# Patient Record
Sex: Male | Born: 1935 | Race: White | Hispanic: No | Marital: Married | Smoking: Never smoker
Health system: Southern US, Community
[De-identification: ages and names within clinical notes are randomized; demographics above are authoritative.]

## PROBLEM LIST (undated history)

## (undated) DIAGNOSIS — G8929 Other chronic pain: Secondary | ICD-10-CM

## (undated) DIAGNOSIS — F419 Anxiety disorder, unspecified: Secondary | ICD-10-CM

## (undated) DIAGNOSIS — M1711 Unilateral primary osteoarthritis, right knee: Secondary | ICD-10-CM

## (undated) DIAGNOSIS — S32030A Wedge compression fracture of third lumbar vertebra, initial encounter for closed fracture: Secondary | ICD-10-CM

## (undated) DIAGNOSIS — K219 Gastro-esophageal reflux disease without esophagitis: Secondary | ICD-10-CM

## (undated) DIAGNOSIS — M792 Neuralgia and neuritis, unspecified: Secondary | ICD-10-CM

## (undated) HISTORY — DX: Wedge compression fracture of third lumbar vertebra, initial encounter for closed fracture: S32.030A

## (undated) HISTORY — DX: Anxiety disorder, unspecified: F41.9

## (undated) HISTORY — DX: Unilateral primary osteoarthritis, right knee: M17.11

## (undated) HISTORY — PX: KYPHOPLASTY: SHX5884

## (undated) HISTORY — DX: Gastro-esophageal reflux disease without esophagitis: K21.9

## (undated) HISTORY — DX: Other chronic pain: G89.29

## (undated) HISTORY — DX: Neuralgia and neuritis, unspecified: M79.2

---

## 2014-03-05 DIAGNOSIS — K5730 Diverticulosis of large intestine without perforation or abscess without bleeding: Secondary | ICD-10-CM

## 2014-03-05 DIAGNOSIS — R7303 Prediabetes: Secondary | ICD-10-CM | POA: Insufficient documentation

## 2014-03-05 DIAGNOSIS — F419 Anxiety disorder, unspecified: Secondary | ICD-10-CM | POA: Insufficient documentation

## 2014-03-05 DIAGNOSIS — N400 Enlarged prostate without lower urinary tract symptoms: Secondary | ICD-10-CM | POA: Insufficient documentation

## 2014-03-05 DIAGNOSIS — I10 Essential (primary) hypertension: Secondary | ICD-10-CM | POA: Insufficient documentation

## 2014-03-05 HISTORY — DX: Diverticulosis of large intestine without perforation or abscess without bleeding: K57.30

## 2014-06-21 DIAGNOSIS — L570 Actinic keratosis: Secondary | ICD-10-CM | POA: Insufficient documentation

## 2016-07-27 DIAGNOSIS — B351 Tinea unguium: Secondary | ICD-10-CM | POA: Insufficient documentation

## 2017-02-24 DIAGNOSIS — S32030A Wedge compression fracture of third lumbar vertebra, initial encounter for closed fracture: Secondary | ICD-10-CM | POA: Insufficient documentation

## 2017-03-18 ENCOUNTER — Other Ambulatory Visit: Payer: Self-pay | Admitting: Orthopedic Surgery

## 2017-03-18 DIAGNOSIS — S32030D Wedge compression fracture of third lumbar vertebra, subsequent encounter for fracture with routine healing: Secondary | ICD-10-CM

## 2017-03-23 ENCOUNTER — Ambulatory Visit
Admission: RE | Admit: 2017-03-23 | Discharge: 2017-03-23 | Disposition: A | Discharge status: Home / Self Care | Payer: Medicare Other | Source: Ambulatory Visit | Attending: Orthopedic Surgery | Admitting: Orthopedic Surgery

## 2017-03-23 DIAGNOSIS — S32030D Wedge compression fracture of third lumbar vertebra, subsequent encounter for fracture with routine healing: Secondary | ICD-10-CM | POA: Insufficient documentation

## 2017-03-23 DIAGNOSIS — X58XXXD Exposure to other specified factors, subsequent encounter: Secondary | ICD-10-CM | POA: Diagnosis not present

## 2017-03-23 DIAGNOSIS — R29890 Loss of height: Secondary | ICD-10-CM | POA: Insufficient documentation

## 2017-03-23 DIAGNOSIS — M47896 Other spondylosis, lumbar region: Secondary | ICD-10-CM | POA: Insufficient documentation

## 2017-03-26 DIAGNOSIS — Z8679 Personal history of other diseases of the circulatory system: Secondary | ICD-10-CM

## 2017-03-26 DIAGNOSIS — E785 Hyperlipidemia, unspecified: Secondary | ICD-10-CM | POA: Insufficient documentation

## 2017-03-26 DIAGNOSIS — H353190 Nonexudative age-related macular degeneration, unspecified eye, stage unspecified: Secondary | ICD-10-CM | POA: Insufficient documentation

## 2017-03-26 DIAGNOSIS — H3530 Unspecified macular degeneration: Secondary | ICD-10-CM | POA: Insufficient documentation

## 2017-03-26 HISTORY — DX: Personal history of other diseases of the circulatory system: Z86.79

## 2017-04-22 ENCOUNTER — Other Ambulatory Visit: Payer: Self-pay | Admitting: Internal Medicine

## 2017-04-22 DIAGNOSIS — R011 Cardiac murmur, unspecified: Secondary | ICD-10-CM

## 2017-04-22 DIAGNOSIS — M7989 Other specified soft tissue disorders: Secondary | ICD-10-CM

## 2017-04-30 ENCOUNTER — Ambulatory Visit
Admission: RE | Admit: 2017-04-30 | Discharge: 2017-04-30 | Disposition: A | Discharge status: Home / Self Care | Payer: Medicare Other | Source: Ambulatory Visit | Attending: Internal Medicine | Admitting: Internal Medicine

## 2017-04-30 DIAGNOSIS — M7989 Other specified soft tissue disorders: Secondary | ICD-10-CM | POA: Insufficient documentation

## 2017-04-30 DIAGNOSIS — R011 Cardiac murmur, unspecified: Secondary | ICD-10-CM

## 2017-04-30 NOTE — Progress Notes (Signed)
*  PRELIMINARY RESULTS* Echocardiogram 2D Echocardiogram has been performed.  Levi BlueHege, Levi Wilkinson 04/30/2017, 11:49 AM

## 2017-05-03 ENCOUNTER — Ambulatory Visit (INDEPENDENT_AMBULATORY_CARE_PROVIDER_SITE_OTHER): Payer: Medicare Other | Admitting: Podiatry

## 2017-05-03 ENCOUNTER — Encounter: Payer: Self-pay | Admitting: Podiatry

## 2017-05-03 DIAGNOSIS — Q828 Other specified congenital malformations of skin: Secondary | ICD-10-CM | POA: Diagnosis not present

## 2017-05-03 DIAGNOSIS — M79675 Pain in left toe(s): Secondary | ICD-10-CM | POA: Diagnosis not present

## 2017-05-03 DIAGNOSIS — B351 Tinea unguium: Secondary | ICD-10-CM

## 2017-05-03 DIAGNOSIS — M79674 Pain in right toe(s): Secondary | ICD-10-CM

## 2017-05-03 NOTE — Progress Notes (Signed)
   Subjective:    Patient ID: Levi Wilkinson, male    DOB: 06/04/1934, 83 y.o.   MRN: [id number]  HPIthis patient presents the office with chief complaint of long thick painful nails.  Patient states that he is unable to self treat since he injured his back while visiting pain.  He says that the nails are long and thick and painful walking and wearing his shoes.  He also says he developed a painful callus on the bottom of his right forefoot.  He says he does have a swelling problem, but is working with his PCP to help control his swelling.  He says he uses compression stockings, but minimal relief has been noted.  He presents the office today for preventative foot care services.    Review of Systems  Musculoskeletal: Positive for back pain.  Skin: Positive for rash.  All other systems reviewed and are negative.      Objective:   Physical Exam General Appearance  Alert, conversant and in no acute stress.  Vascular  Dorsalis pedis and posterior pulses are palpable  bilaterally.  Capillary return is within normal limits  bilaterally. Temperature is within normal limits  Bilaterally. Swelling feet/legs  B/L.  Neurologic  Senn-Weinstein monofilament wire test within normal limits  bilaterally. Muscle power within normal limits bilaterally.  Nails Thick disfigured discolored nails with subungual debris bilaterally from hallux to fifth toes bilaterally. No evidence of bacterial infection or drainage bilaterally.  Orthopedic  No limitations of motion of motion feet bilaterally.  No crepitus or effusions noted.  No bony pathology or digital deformities noted.  Skin  normotropic skin with no porokeratosis noted bilaterally.  No signs of infections or ulcers noted.          Assessment & Plan:  Onychomycosis  B/L  Swelling  B/L   IE  Debride nails  X 10.  Patient to work with PCP about his swelling.  RTC 3 months   Levi GuntherGregory Raphaella Wilkinson DPM

## 2017-05-06 DIAGNOSIS — M8589 Other specified disorders of bone density and structure, multiple sites: Secondary | ICD-10-CM | POA: Insufficient documentation

## 2017-08-02 ENCOUNTER — Ambulatory Visit: Payer: Medicare Other | Admitting: Podiatry

## 2017-08-03 ENCOUNTER — Encounter: Payer: Self-pay | Admitting: Podiatry

## 2017-08-03 ENCOUNTER — Ambulatory Visit (INDEPENDENT_AMBULATORY_CARE_PROVIDER_SITE_OTHER): Payer: Medicare Other | Admitting: Podiatry

## 2017-08-03 DIAGNOSIS — B351 Tinea unguium: Secondary | ICD-10-CM | POA: Diagnosis not present

## 2017-08-03 DIAGNOSIS — M79676 Pain in unspecified toe(s): Secondary | ICD-10-CM

## 2017-08-03 DIAGNOSIS — Q828 Other specified congenital malformations of skin: Secondary | ICD-10-CM | POA: Diagnosis not present

## 2017-08-10 NOTE — Progress Notes (Signed)
    Subjective: Patient is a 83 y.o. male presenting to the office today with a chief complaint of a painful callus lesion to the right forefoot that has been present for several weeks. Ambulation on applying pressure to the area increases the pain.   Patient also complains of elongated, thickened nails that cause pain while ambulating in shoes. He is unable to trim his own nails. Patient presents today for further treatment and evaluation.  No past medical history on file.  Objective:  Physical Exam General: Alert and oriented x3 in no acute distress  Dermatology: Hyperkeratotic lesion present on the right forefoot. Pain on palpation with a central nucleated core noted. Skin is warm, dry and supple bilateral lower extremities. Negative for open lesions or macerations. Nails are tender, long, thickened and dystrophic with subungual debris, consistent with onychomycosis, 1-5 bilateral. No signs of infection noted.  Vascular: Palpable pedal pulses bilaterally. No edema or erythema noted. Capillary refill within normal limits.  Neurological: Epicritic and protective threshold grossly intact bilaterally.   Musculoskeletal Exam: Pain on palpation at the keratotic lesion noted. Range of motion within normal limits bilateral. Muscle strength 5/5 in all groups bilateral.  Assessment: 1. Onychodystrophic nails 1-5 bilateral with hyperkeratosis of nails.  2. Onychomycosis of nail due to dermatophyte bilateral 3. Porokeratosis noted to the right forefoot   Plan of Care:  1. Patient evaluated. 2. Excisional debridement of keratoic lesion using a chisel blade was performed without incident.  3. Dressed with light dressing. 4. Mechanical debridement of nails 1-5 bilaterally performed using a nail nipper. Filed with dremel without incident.  5. Patient is to return to the clinic in 3 months.   Felecia ShellingBrent M. Evans, DPM [facility]  Dr. Felecia ShellingBrent M. Evans, DPM    [address]                                         [place], [place] [address]                Office [phone]  Fax [phone]

## 2017-09-30 DIAGNOSIS — M545 Low back pain: Secondary | ICD-10-CM

## 2017-09-30 DIAGNOSIS — G8929 Other chronic pain: Secondary | ICD-10-CM | POA: Insufficient documentation

## 2017-11-09 ENCOUNTER — Ambulatory Visit: Payer: Medicare Other | Admitting: Podiatry

## 2017-11-12 ENCOUNTER — Ambulatory Visit: Payer: Medicare Other | Admitting: Podiatry

## 2017-11-23 ENCOUNTER — Ambulatory Visit (INDEPENDENT_AMBULATORY_CARE_PROVIDER_SITE_OTHER): Payer: Medicare Other | Admitting: Podiatry

## 2017-11-23 ENCOUNTER — Encounter: Payer: Self-pay | Admitting: Podiatry

## 2017-11-23 DIAGNOSIS — M79676 Pain in unspecified toe(s): Secondary | ICD-10-CM | POA: Diagnosis not present

## 2017-11-23 DIAGNOSIS — Q828 Other specified congenital malformations of skin: Secondary | ICD-10-CM | POA: Diagnosis not present

## 2017-11-23 DIAGNOSIS — B351 Tinea unguium: Secondary | ICD-10-CM

## 2017-11-26 NOTE — Progress Notes (Signed)
    Subjective: Patient is a 83 y.o. male presenting to the office today with a chief complaint of a painful callus lesion to the right foot that has been present for several months. Bearing weight increases the pain. He has not done anything at home for treatment.   Patient also complains of elongated, thickened nails that cause pain while ambulating in shoes. He is unable to trim his own nails. Patient presents today for further treatment and evaluation.  No past medical history on file.  Objective:  Physical Exam General: Alert and oriented x3 in no acute distress  Dermatology: Hyperkeratotic lesion present on the right foot. Pain on palpation with a central nucleated core noted. Skin is warm, dry and supple bilateral lower extremities. Negative for open lesions or macerations. Nails are tender, long, thickened and dystrophic with subungual debris, consistent with onychomycosis, 1-5 bilateral. No signs of infection noted.  Vascular: Palpable pedal pulses bilaterally. No edema or erythema noted. Capillary refill within normal limits.  Neurological: Epicritic and protective threshold grossly intact bilaterally.   Musculoskeletal Exam: Pain on palpation at the keratotic lesion noted. Range of motion within normal limits bilateral. Muscle strength 5/5 in all groups bilateral.  Assessment: 1. Onychodystrophic nails 1-5 bilateral with hyperkeratosis of nails.  2. Onychomycosis of nail due to dermatophyte bilateral 3. Porokeratosis noted to the right foot   Plan of Care:  1. Patient evaluated. 2. Excisional debridement of keratoic lesion using a chisel blade was performed without incident.  3. Dressed with light dressing. 4. Mechanical debridement of nails 1-5 bilaterally performed using a nail nipper. Filed with dremel without incident.  5. Patient is to return to the clinic in 3 months.   Felecia ShellingBrent M. Gideon Burstein, DPM [facility]  Dr. Felecia ShellingBrent M. Billiejean Schimek, DPM    [address]                                        [place], [place] [address]                Office [phone]  Fax [phone]

## 2017-12-30 DIAGNOSIS — M542 Cervicalgia: Secondary | ICD-10-CM | POA: Insufficient documentation

## 2018-02-22 ENCOUNTER — Encounter: Payer: Self-pay | Admitting: Podiatry

## 2018-02-22 ENCOUNTER — Ambulatory Visit (INDEPENDENT_AMBULATORY_CARE_PROVIDER_SITE_OTHER): Payer: Medicare Other | Admitting: Podiatry

## 2018-02-22 DIAGNOSIS — Q828 Other specified congenital malformations of skin: Secondary | ICD-10-CM | POA: Diagnosis not present

## 2018-02-22 DIAGNOSIS — B351 Tinea unguium: Secondary | ICD-10-CM | POA: Diagnosis not present

## 2018-02-22 DIAGNOSIS — M79676 Pain in unspecified toe(s): Secondary | ICD-10-CM | POA: Diagnosis not present

## 2018-02-24 NOTE — Progress Notes (Signed)
    Subjective: Patient is a 83 y.o. male presenting to the office today with a chief complaint of a painful callus lesion to the right foot that has been present for several months. Bearing weight increases the pain. He has not done anything at home for treatment.   Patient also complains of elongated, thickened nails that cause pain while ambulating in shoes. He is unable to trim his own nails. Patient presents today for further treatment and evaluation.  No past medical history on file.  Objective:  Physical Exam General: Alert and oriented x3 in no acute distress  Dermatology: Hyperkeratotic lesion present on the right foot. Pain on palpation with a central nucleated core noted. Skin is warm, dry and supple bilateral lower extremities. Negative for open lesions or macerations. Nails are tender, long, thickened and dystrophic with subungual debris, consistent with onychomycosis, 1-5 bilateral. No signs of infection noted.  Vascular: Palpable pedal pulses bilaterally. No edema or erythema noted. Capillary refill within normal limits.  Neurological: Epicritic and protective threshold grossly intact bilaterally.   Musculoskeletal Exam: Pain on palpation at the keratotic lesion noted. Range of motion within normal limits bilateral. Muscle strength 5/5 in all groups bilateral.  Assessment: 1. Onychodystrophic nails 1-5 bilateral with hyperkeratosis of nails.  2. Onychomycosis of nail due to dermatophyte bilateral 3. Porokeratosis noted to the right foot   Plan of Care:  1. Patient evaluated. 2. Excisional debridement of keratoic lesion using a chisel blade was performed without incident.  3. Dressed with light dressing. 4. Mechanical debridement of nails 1-5 bilaterally performed using a nail nipper. Filed with dremel without incident.  5. Patient is to return to the clinic in 3 months.   Khristen Cheyney M. Johari Bennetts, DPM [facility]  Dr. Larae Caison M. Kendricks Reap, DPM    [address]                                        [place], [place] [address]                Office [phone]  Fax [phone]    

## 2018-06-21 ENCOUNTER — Ambulatory Visit (INDEPENDENT_AMBULATORY_CARE_PROVIDER_SITE_OTHER): Payer: Medicare Other | Admitting: Podiatry

## 2018-06-21 ENCOUNTER — Encounter: Payer: Self-pay | Admitting: Podiatry

## 2018-06-21 DIAGNOSIS — B351 Tinea unguium: Secondary | ICD-10-CM

## 2018-06-21 DIAGNOSIS — M79676 Pain in unspecified toe(s): Secondary | ICD-10-CM | POA: Diagnosis not present

## 2018-06-23 NOTE — Progress Notes (Signed)
    Subjective: Patient is a 84 y.o. male presenting to the office today for follow up evaluation of a painful callus lesion to the right foot. Bearing weight increases the pain. He has not done anything at home for treatment.   Patient also complains of elongated, thickened nails that cause pain while ambulating in shoes. He is unable to trim his own nails. Patient presents today for further treatment and evaluation.  History reviewed. No pertinent past medical history.  Objective:  Physical Exam General: Alert and oriented x3 in no acute distress  Dermatology: Hyperkeratotic lesion present on the right foot. Pain on palpation with a central nucleated core noted. Skin is warm, dry and supple bilateral lower extremities. Negative for open lesions or macerations. Nails are tender, long, thickened and dystrophic with subungual debris, consistent with onychomycosis, 1-5 bilateral. No signs of infection noted.  Vascular: Palpable pedal pulses bilaterally. No edema or erythema noted. Capillary refill within normal limits.  Neurological: Epicritic and protective threshold grossly intact bilaterally.   Musculoskeletal Exam: Pain on palpation at the keratotic lesion noted. Range of motion within normal limits bilateral. Muscle strength 5/5 in all groups bilateral.  Assessment: 1. Onychodystrophic nails 1-5 bilateral with hyperkeratosis of nails.  2. Onychomycosis of nail due to dermatophyte bilateral 3. Porokeratosis noted to the right foot   Plan of Care:  1. Patient evaluated. 2. Excisional debridement of keratoic lesion using a chisel blade was performed without incident.  3. Dressed with light dressing. 4. Mechanical debridement of nails 1-5 bilaterally performed using a nail nipper. Filed with dremel without incident.  5. Patient is to return to the clinic in 3 months.   Felecia Shelling, DPM [facility]  Dr. Felecia Shelling, DPM    [address]                                         [place], [place] [address]                Office [phone]  Fax [phone]

## 2018-10-18 ENCOUNTER — Other Ambulatory Visit: Payer: Self-pay

## 2018-10-18 ENCOUNTER — Ambulatory Visit (INDEPENDENT_AMBULATORY_CARE_PROVIDER_SITE_OTHER): Payer: Medicare Other | Admitting: Podiatry

## 2018-10-18 DIAGNOSIS — M79676 Pain in unspecified toe(s): Secondary | ICD-10-CM

## 2018-10-18 DIAGNOSIS — B351 Tinea unguium: Secondary | ICD-10-CM

## 2018-10-19 NOTE — Progress Notes (Signed)
   SUBJECTIVE Patient presents to office today complaining of elongated, thickened nails that cause pain while ambulating in shoes. He is unable to trim his own nails. Patient is here for further evaluation and treatment.  No past medical history on file.  OBJECTIVE General Patient is awake, alert, and oriented x 3 and in no acute distress. Derm Skin is dry and supple bilateral. Negative open lesions or macerations. Remaining integument unremarkable. Nails are tender, long, thickened and dystrophic with subungual debris, consistent with onychomycosis, 1-5 bilateral. No signs of infection noted. Vasc  DP and PT pedal pulses palpable bilaterally. Temperature gradient within normal limits.  Neuro Epicritic and protective threshold sensation grossly intact bilaterally.  Musculoskeletal Exam No symptomatic pedal deformities noted bilateral. Muscular strength within normal limits.  ASSESSMENT 1. Onychodystrophic nails 1-5 bilateral with hyperkeratosis of nails.  2. Onychomycosis of nail due to dermatophyte bilateral 3. Pain in foot bilateral  PLAN OF CARE 1. Patient evaluated today.  2. Instructed to maintain good pedal hygiene and foot care.  3. Mechanical debridement of nails 1-5 bilaterally performed using a nail nipper. Filed with dremel without incident.  4. Return to clinic in 3 mos.    Brent M. Evans, DPM [facility]  Dr. Brent M. Evans, DPM    [address]                                        [place], [place] [address]                Office [phone]  Fax [phone]      

## 2019-01-17 ENCOUNTER — Other Ambulatory Visit: Payer: Self-pay

## 2019-01-17 ENCOUNTER — Ambulatory Visit (INDEPENDENT_AMBULATORY_CARE_PROVIDER_SITE_OTHER): Payer: Medicare Other | Admitting: Podiatry

## 2019-01-17 ENCOUNTER — Encounter: Payer: Self-pay | Admitting: Podiatry

## 2019-01-17 DIAGNOSIS — M79676 Pain in unspecified toe(s): Secondary | ICD-10-CM | POA: Diagnosis not present

## 2019-01-17 DIAGNOSIS — B351 Tinea unguium: Secondary | ICD-10-CM

## 2019-01-17 NOTE — Progress Notes (Signed)
   SUBJECTIVE Patient presents to office today complaining of elongated, thickened nails that cause pain while ambulating in shoes. He is unable to trim his own nails. Patient is here for further evaluation and treatment.  No past medical history on file.  OBJECTIVE General Patient is awake, alert, and oriented x 3 and in no acute distress. Derm Skin is dry and supple bilateral. Negative open lesions or macerations. Remaining integument unremarkable. Nails are tender, long, thickened and dystrophic with subungual debris, consistent with onychomycosis, 1-5 bilateral. No signs of infection noted. Vasc  DP and PT pedal pulses palpable bilaterally. Temperature gradient within normal limits.  Neuro Epicritic and protective threshold sensation grossly intact bilaterally.  Musculoskeletal Exam No symptomatic pedal deformities noted bilateral. Muscular strength within normal limits.  ASSESSMENT 1. Onychodystrophic nails 1-5 bilateral with hyperkeratosis of nails.  2. Onychomycosis of nail due to dermatophyte bilateral 3. Pain in foot bilateral  PLAN OF CARE 1. Patient evaluated today.  2. Instructed to maintain good pedal hygiene and foot care.  3. Mechanical debridement of nails 1-5 bilaterally performed using a nail nipper. Filed with dremel without incident.  4. Return to clinic in 3 mos.    Brent M. Evans, DPM [facility]  Dr. Brent M. Evans, DPM    [address]                                        [place], [place] [address]                Office [phone]  Fax [phone]      

## 2019-05-02 ENCOUNTER — Encounter: Payer: Self-pay | Admitting: Podiatry

## 2019-05-02 ENCOUNTER — Ambulatory Visit (INDEPENDENT_AMBULATORY_CARE_PROVIDER_SITE_OTHER): Payer: Medicare Other | Admitting: Podiatry

## 2019-05-02 ENCOUNTER — Other Ambulatory Visit: Payer: Self-pay

## 2019-05-02 DIAGNOSIS — B351 Tinea unguium: Secondary | ICD-10-CM | POA: Diagnosis not present

## 2019-05-02 DIAGNOSIS — M79675 Pain in left toe(s): Secondary | ICD-10-CM

## 2019-05-02 DIAGNOSIS — M79674 Pain in right toe(s): Secondary | ICD-10-CM

## 2019-05-05 NOTE — Progress Notes (Signed)
   SUBJECTIVE Patient presents to office today complaining of elongated, thickened nails that cause pain while ambulating in shoes. He is unable to trim his own nails. Patient is here for further evaluation and treatment.  No past medical history on file.  OBJECTIVE General Patient is awake, alert, and oriented x 3 and in no acute distress. Derm Skin is dry and supple bilateral. Negative open lesions or macerations. Remaining integument unremarkable. Nails are tender, long, thickened and dystrophic with subungual debris, consistent with onychomycosis, 1-5 bilateral. No signs of infection noted. Vasc  DP and PT pedal pulses palpable bilaterally. Temperature gradient within normal limits.  Neuro Epicritic and protective threshold sensation grossly intact bilaterally.  Musculoskeletal Exam No symptomatic pedal deformities noted bilateral. Muscular strength within normal limits.  ASSESSMENT 1. Onychodystrophic nails 1-5 bilateral with hyperkeratosis of nails.  2. Onychomycosis of nail due to dermatophyte bilateral 3. Pain in foot bilateral  PLAN OF CARE 1. Patient evaluated today.  2. Instructed to maintain good pedal hygiene and foot care.  3. Mechanical debridement of nails 1-5 bilaterally performed using a nail nipper. Filed with dremel without incident.  4. Return to clinic in 3 mos.    Mccartney Brucks M. Evony Rezek, DPM [facility]  Dr. Ahmon Tosi M. Indigo Barbian, DPM    [address]                                        [place], [place] [address]                Office [phone]  Fax [phone]      

## 2019-08-29 ENCOUNTER — Other Ambulatory Visit: Payer: Self-pay

## 2019-08-29 ENCOUNTER — Ambulatory Visit (INDEPENDENT_AMBULATORY_CARE_PROVIDER_SITE_OTHER): Payer: Medicare Other | Admitting: Podiatry

## 2019-08-29 DIAGNOSIS — M79675 Pain in left toe(s): Secondary | ICD-10-CM

## 2019-08-29 DIAGNOSIS — B351 Tinea unguium: Secondary | ICD-10-CM | POA: Diagnosis not present

## 2019-08-29 DIAGNOSIS — M79674 Pain in right toe(s): Secondary | ICD-10-CM

## 2019-09-01 NOTE — Progress Notes (Signed)
   SUBJECTIVE Patient presents to office today complaining of elongated, thickened nails that cause pain while ambulating in shoes. He is unable to trim his own nails. Patient is here for further evaluation and treatment.  No past medical history on file.  OBJECTIVE General Patient is awake, alert, and oriented x 3 and in no acute distress. Derm Skin is dry and supple bilateral. Negative open lesions or macerations. Remaining integument unremarkable. Nails are tender, long, thickened and dystrophic with subungual debris, consistent with onychomycosis, 1-5 bilateral. No signs of infection noted. Vasc  DP and PT pedal pulses palpable bilaterally. Temperature gradient within normal limits.  Neuro Epicritic and protective threshold sensation grossly intact bilaterally.  Musculoskeletal Exam No symptomatic pedal deformities noted bilateral. Muscular strength within normal limits.  ASSESSMENT 1. Onychodystrophic nails 1-5 bilateral with hyperkeratosis of nails.  2. Onychomycosis of nail due to dermatophyte bilateral 3. Pain in foot bilateral  PLAN OF CARE 1. Patient evaluated today.  2. Instructed to maintain good pedal hygiene and foot care.  3. Mechanical debridement of nails 1-5 bilaterally performed using a nail nipper. Filed with dremel without incident.  4. Return to clinic in 3 mos.    Shermaine Brigham M. Unity Luepke, DPM [facility]  Dr. Ramir Malerba M. Markeese Boyajian, DPM    [address]                                        [place], [place] [address]                Office [phone]  Fax [phone]      

## 2019-12-01 ENCOUNTER — Other Ambulatory Visit: Payer: Self-pay

## 2019-12-01 ENCOUNTER — Ambulatory Visit (INDEPENDENT_AMBULATORY_CARE_PROVIDER_SITE_OTHER): Payer: Medicare Other | Admitting: Podiatry

## 2019-12-01 DIAGNOSIS — M79675 Pain in left toe(s): Secondary | ICD-10-CM

## 2019-12-01 DIAGNOSIS — M79674 Pain in right toe(s): Secondary | ICD-10-CM | POA: Diagnosis not present

## 2019-12-01 DIAGNOSIS — B351 Tinea unguium: Secondary | ICD-10-CM

## 2019-12-01 NOTE — Progress Notes (Signed)
   SUBJECTIVE Patient presents to office today complaining of elongated, thickened nails that cause pain while ambulating in shoes. He is unable to trim his own nails. Patient is here for further evaluation and treatment.  No past medical history on file.  OBJECTIVE General Patient is awake, alert, and oriented x 3 and in no acute distress. Derm Skin is dry and supple bilateral. Negative open lesions or macerations. Remaining integument unremarkable. Nails are tender, long, thickened and dystrophic with subungual debris, consistent with onychomycosis, 1-5 bilateral. No signs of infection noted. Vasc  DP and PT pedal pulses palpable bilaterally. Temperature gradient within normal limits.  Neuro Epicritic and protective threshold sensation grossly intact bilaterally.  Musculoskeletal Exam No symptomatic pedal deformities noted bilateral. Muscular strength within normal limits.  ASSESSMENT 1. Onychodystrophic nails 1-5 bilateral with hyperkeratosis of nails.  2. Onychomycosis of nail due to dermatophyte bilateral 3. Pain in foot bilateral  PLAN OF CARE 1. Patient evaluated today.  2. Instructed to maintain good pedal hygiene and foot care.  3. Mechanical debridement of nails 1-5 bilaterally performed using a nail nipper. Filed with dremel without incident.  4. Return to clinic in 3 mos.    Daryle Boyington M. Juliano Mceachin, DPM [facility]  Dr. Nitika Jackowski M. Anwar Crill, DPM    [address]                                        [place], [place] [address]                Office [phone]  Fax [phone]      

## 2020-03-05 ENCOUNTER — Ambulatory Visit (INDEPENDENT_AMBULATORY_CARE_PROVIDER_SITE_OTHER): Payer: Medicare Other | Admitting: Podiatry

## 2020-03-05 ENCOUNTER — Other Ambulatory Visit: Payer: Self-pay

## 2020-03-05 DIAGNOSIS — M79675 Pain in left toe(s): Secondary | ICD-10-CM

## 2020-03-05 DIAGNOSIS — M79674 Pain in right toe(s): Secondary | ICD-10-CM | POA: Diagnosis not present

## 2020-03-05 DIAGNOSIS — B351 Tinea unguium: Secondary | ICD-10-CM

## 2020-03-05 NOTE — Progress Notes (Signed)
   SUBJECTIVE Patient presents to office today complaining of elongated, thickened nails that cause pain while ambulating in shoes. He is unable to trim his own nails. Patient is here for further evaluation and treatment.  No past medical history on file.  OBJECTIVE General Patient is awake, alert, and oriented x 3 and in no acute distress. Derm Skin is dry and supple bilateral. Negative open lesions or macerations. Remaining integument unremarkable. Nails are tender, long, thickened and dystrophic with subungual debris, consistent with onychomycosis, 1-5 bilateral. No signs of infection noted. Vasc  DP and PT pedal pulses palpable bilaterally. Temperature gradient within normal limits.  Neuro Epicritic and protective threshold sensation grossly intact bilaterally.  Musculoskeletal Exam No symptomatic pedal deformities noted bilateral. Muscular strength within normal limits.  ASSESSMENT 1. Onychodystrophic nails 1-5 bilateral with hyperkeratosis of nails.  2. Onychomycosis of nail due to dermatophyte bilateral 3. Pain in foot bilateral  PLAN OF CARE 1. Patient evaluated today.  2. Instructed to maintain good pedal hygiene and foot care.  3. Mechanical debridement of nails 1-5 bilaterally performed using a nail nipper. Filed with dremel without incident.  4. Return to clinic in 3 mos.    Torra Pala M. Adeli Frost, DPM [facility]  Dr. Jenasis Straley M. Ajia Chadderdon, DPM    [address]                                        [place], [place] [address]                Office [phone]  Fax [phone]      

## 2020-06-07 ENCOUNTER — Encounter: Payer: Self-pay | Admitting: Podiatry

## 2020-06-07 ENCOUNTER — Other Ambulatory Visit: Payer: Self-pay

## 2020-06-07 ENCOUNTER — Ambulatory Visit (INDEPENDENT_AMBULATORY_CARE_PROVIDER_SITE_OTHER): Payer: Medicare Other | Admitting: Podiatry

## 2020-06-07 DIAGNOSIS — B351 Tinea unguium: Secondary | ICD-10-CM | POA: Diagnosis not present

## 2020-06-07 DIAGNOSIS — M79674 Pain in right toe(s): Secondary | ICD-10-CM

## 2020-06-07 DIAGNOSIS — M79675 Pain in left toe(s): Secondary | ICD-10-CM | POA: Diagnosis not present

## 2020-06-07 NOTE — Progress Notes (Signed)
A 

## 2020-06-07 NOTE — Progress Notes (Signed)
   SUBJECTIVE Patient presents to office today complaining of elongated, thickened nails that cause pain while ambulating in shoes. He is unable to trim his own nails. Patient is here for further evaluation and treatment.  No past medical history on file.  OBJECTIVE General Patient is awake, alert, and oriented x 3 and in no acute distress. Derm Skin is dry and supple bilateral. Negative open lesions or macerations. Remaining integument unremarkable. Nails are tender, long, thickened and dystrophic with subungual debris, consistent with onychomycosis, 1-5 bilateral. No signs of infection noted. Vasc  DP and PT pedal pulses palpable bilaterally. Temperature gradient within normal limits.  Neuro Epicritic and protective threshold sensation grossly intact bilaterally.  Musculoskeletal Exam No symptomatic pedal deformities noted bilateral. Muscular strength within normal limits.  ASSESSMENT 1. Onychodystrophic nails 1-5 bilateral with hyperkeratosis of nails.  2. Onychomycosis of nail due to dermatophyte bilateral 3. Pain in foot bilateral  PLAN OF CARE 1. Patient evaluated today.  2. Instructed to maintain good pedal hygiene and foot care.  3. Mechanical debridement of nails 1-5 bilaterally performed using a nail nipper. Filed with dremel without incident.  4. Return to clinic in 3 mos.    Felecia Shelling, DPM [facility]  Dr. Felecia Shelling, DPM    [address] [place], [place] [address]                Office [phone]  Fax [phone]

## 2020-09-06 ENCOUNTER — Encounter (INDEPENDENT_AMBULATORY_CARE_PROVIDER_SITE_OTHER): Payer: Self-pay

## 2020-09-06 ENCOUNTER — Ambulatory Visit: Payer: Medicare Other | Admitting: Podiatry

## 2020-09-06 ENCOUNTER — Other Ambulatory Visit: Payer: Self-pay

## 2020-09-06 DIAGNOSIS — B351 Tinea unguium: Secondary | ICD-10-CM | POA: Diagnosis not present

## 2020-09-06 DIAGNOSIS — M79674 Pain in right toe(s): Secondary | ICD-10-CM | POA: Diagnosis not present

## 2020-09-06 DIAGNOSIS — M79675 Pain in left toe(s): Secondary | ICD-10-CM

## 2020-09-22 NOTE — Progress Notes (Signed)
   SUBJECTIVE Patient presents to office today complaining of elongated, thickened nails that cause pain while ambulating in shoes. He is unable to trim his own nails. Patient is here for further evaluation and treatment.  No past medical history on file.  OBJECTIVE General Patient is awake, alert, and oriented x 3 and in no acute distress. Derm Skin is dry and supple bilateral. Negative open lesions or macerations. Remaining integument unremarkable. Nails are tender, long, thickened and dystrophic with subungual debris, consistent with onychomycosis, 1-5 bilateral. No signs of infection noted. Vasc  DP and PT pedal pulses palpable bilaterally. Temperature gradient within normal limits.  Neuro Epicritic and protective threshold sensation grossly intact bilaterally.  Musculoskeletal Exam No symptomatic pedal deformities noted bilateral. Muscular strength within normal limits.  ASSESSMENT 1. Onychodystrophic nails 1-5 bilateral with hyperkeratosis of nails.  2. Onychomycosis of nail due to dermatophyte bilateral 3. Pain in foot bilateral  PLAN OF CARE 1. Patient evaluated today.  2. Instructed to maintain good pedal hygiene and foot care.  3. Mechanical debridement of nails 1-5 bilaterally performed using a nail nipper. Filed with dremel without incident.  4. Return to clinic in 3 mos.    Felecia Shelling, DPM [facility]  Dr. Felecia Shelling, DPM    [address] [place], [place] [address]                Office [phone]  Fax [phone]

## 2020-12-13 ENCOUNTER — Encounter: Payer: Self-pay | Admitting: Podiatry

## 2020-12-13 ENCOUNTER — Ambulatory Visit: Payer: Medicare Other | Admitting: Podiatry

## 2020-12-13 ENCOUNTER — Other Ambulatory Visit: Payer: Self-pay

## 2020-12-13 DIAGNOSIS — M79675 Pain in left toe(s): Secondary | ICD-10-CM | POA: Diagnosis not present

## 2020-12-13 DIAGNOSIS — M79674 Pain in right toe(s): Secondary | ICD-10-CM

## 2020-12-13 DIAGNOSIS — B351 Tinea unguium: Secondary | ICD-10-CM | POA: Diagnosis not present

## 2020-12-13 NOTE — Progress Notes (Signed)
   SUBJECTIVE Patient presents to office today complaining of elongated, thickened nails that cause pain while ambulating in shoes. He is unable to trim his own nails. Patient is here for further evaluation and treatment.  No past medical history on file.  OBJECTIVE General Patient is awake, alert, and oriented x 3 and in no acute distress. Derm Skin is dry and supple bilateral. Negative open lesions or macerations. Remaining integument unremarkable. Nails are tender, long, thickened and dystrophic with subungual debris, consistent with onychomycosis, 1-5 bilateral. No signs of infection noted. Vasc  DP and PT pedal pulses palpable bilaterally. Temperature gradient within normal limits.  Neuro Epicritic and protective threshold sensation grossly intact bilaterally.  Musculoskeletal Exam No symptomatic pedal deformities noted bilateral. Muscular strength within normal limits.  ASSESSMENT 1. Onychodystrophic nails 1-5 bilateral with hyperkeratosis of nails.  2. Onychomycosis of nail due to dermatophyte bilateral 3. Pain in foot bilateral  PLAN OF CARE 1. Patient evaluated today.  2. Instructed to maintain good pedal hygiene and foot care.  3. Mechanical debridement of nails 1-5 bilaterally performed using a nail nipper. Filed with dremel without incident.  4. Return to clinic in 3 mos.    Felecia Shelling, DPM [facility]  Dr. Felecia Shelling, DPM    [address] [place], [place] [address]                Office [phone]  Fax [phone]

## 2021-02-27 ENCOUNTER — Other Ambulatory Visit: Payer: Self-pay | Admitting: Internal Medicine

## 2021-02-27 DIAGNOSIS — R1313 Dysphagia, pharyngeal phase: Secondary | ICD-10-CM

## 2021-03-11 ENCOUNTER — Other Ambulatory Visit: Payer: Self-pay

## 2021-03-11 ENCOUNTER — Ambulatory Visit
Admission: RE | Admit: 2021-03-11 | Discharge: 2021-03-11 | Disposition: A | Discharge status: Home / Self Care | Payer: Medicare Other | Source: Ambulatory Visit | Attending: Internal Medicine | Admitting: Internal Medicine

## 2021-03-11 DIAGNOSIS — R1313 Dysphagia, pharyngeal phase: Secondary | ICD-10-CM | POA: Diagnosis not present

## 2021-03-18 ENCOUNTER — Ambulatory Visit: Payer: Medicare Other | Admitting: Podiatry

## 2021-03-18 ENCOUNTER — Other Ambulatory Visit: Payer: Self-pay

## 2021-03-18 DIAGNOSIS — B351 Tinea unguium: Secondary | ICD-10-CM | POA: Diagnosis not present

## 2021-03-18 DIAGNOSIS — M79674 Pain in right toe(s): Secondary | ICD-10-CM

## 2021-03-18 DIAGNOSIS — M79675 Pain in left toe(s): Secondary | ICD-10-CM | POA: Diagnosis not present

## 2021-03-18 NOTE — Progress Notes (Signed)
   SUBJECTIVE Patient presents to office today complaining of elongated, thickened nails that cause pain while ambulating in shoes. He is unable to trim his own nails. Patient is here for further evaluation and treatment.  No past medical history on file.  OBJECTIVE General Patient is awake, alert, and oriented x 3 and in no acute distress. Derm Skin is dry and supple bilateral. Negative open lesions or macerations. Remaining integument unremarkable. Nails are tender, long, thickened and dystrophic with subungual debris, consistent with onychomycosis, 1-5 bilateral. No signs of infection noted. Vasc  DP and PT pedal pulses palpable bilaterally. Temperature gradient within normal limits.  Neuro Epicritic and protective threshold sensation grossly intact bilaterally.  Musculoskeletal Exam No symptomatic pedal deformities noted bilateral. Muscular strength within normal limits.  ASSESSMENT 1. Onychodystrophic nails 1-5 bilateral with hyperkeratosis of nails.  2. Onychomycosis of nail due to dermatophyte bilateral 3. Pain in foot bilateral  PLAN OF CARE 1. Patient evaluated today.  2. Instructed to maintain good pedal hygiene and foot care.  3. Mechanical debridement of nails 1-5 bilaterally performed using a nail nipper. Filed with dremel without incident.  4. Return to clinic in 3 mos.    Felecia Shelling, DPM [facility]  Dr. Felecia Shelling, DPM    [address] [place], [place] [address]                Office [phone]  Fax [phone]

## 2021-06-17 ENCOUNTER — Ambulatory Visit: Payer: Medicare Other | Admitting: Podiatry

## 2021-06-17 ENCOUNTER — Other Ambulatory Visit: Payer: Self-pay

## 2021-06-17 ENCOUNTER — Encounter: Payer: Self-pay | Admitting: Podiatry

## 2021-06-17 DIAGNOSIS — B351 Tinea unguium: Secondary | ICD-10-CM | POA: Diagnosis not present

## 2021-06-17 DIAGNOSIS — L989 Disorder of the skin and subcutaneous tissue, unspecified: Secondary | ICD-10-CM | POA: Diagnosis not present

## 2021-06-17 DIAGNOSIS — M79675 Pain in left toe(s): Secondary | ICD-10-CM

## 2021-06-17 DIAGNOSIS — M79674 Pain in right toe(s): Secondary | ICD-10-CM | POA: Diagnosis not present

## 2021-06-17 NOTE — Progress Notes (Signed)
° °  SUBJECTIVE Patient presents to office today complaining of elongated, thickened nails that cause pain while ambulating in shoes. He is unable to trim his own nails. Patient is here for further evaluation and treatment.  No past medical history on file.  OBJECTIVE General Patient is awake, alert, and oriented x 3 and in no acute distress. Derm Skin is dry and supple bilateral. Negative open lesions or macerations. Remaining integument unremarkable. Nails are tender, long, thickened and dystrophic with subungual debris, consistent with onychomycosis, 1-5 bilateral. No signs of infection noted.  Hyperkeratotic preulcerative callus tissue to the distal tips of the toes right foot Vasc  DP and PT pedal pulses palpable bilaterally. Temperature gradient within normal limits.  Neuro Epicritic and protective threshold sensation grossly intact bilaterally.  Musculoskeletal Exam No symptomatic pedal deformities noted bilateral. Muscular strength within normal limits.  ASSESSMENT 1.  Pain due to onychomycosis of toenails above 2.  Hyperkeratotic preulcerative callus tissue distal tip of the toes right  PLAN OF CARE 1. Patient evaluated today.  2. Instructed to maintain good pedal hygiene and foot care.  3. Mechanical debridement of nails 1-5 bilaterally performed using a nail nipper. Filed with dremel without incident.  4.  Excisional debridement of the hyperkeratotic preulcerative callus tissue was performed using a 312 scalpel without incident or bleeding as a courtesy to the patient  5.  Return to clinic in 3 mos.    Raegen Tarpley M. Mardie Kellen, DPM [facility]  Dr. Akanksha Bellmore M. Chonda Baney, DPM    [address]                                [place], [place] [address]                Office [phone]  Fax [phone]   

## 2021-09-16 ENCOUNTER — Encounter: Payer: Self-pay | Admitting: Podiatry

## 2021-09-16 ENCOUNTER — Ambulatory Visit (INDEPENDENT_AMBULATORY_CARE_PROVIDER_SITE_OTHER): Payer: Medicare Other | Admitting: Podiatry

## 2021-09-16 DIAGNOSIS — M79675 Pain in left toe(s): Secondary | ICD-10-CM

## 2021-09-16 DIAGNOSIS — B351 Tinea unguium: Secondary | ICD-10-CM

## 2021-09-16 DIAGNOSIS — L989 Disorder of the skin and subcutaneous tissue, unspecified: Secondary | ICD-10-CM | POA: Diagnosis not present

## 2021-09-16 DIAGNOSIS — M79674 Pain in right toe(s): Secondary | ICD-10-CM | POA: Diagnosis not present

## 2021-09-16 NOTE — Progress Notes (Signed)
   SUBJECTIVE Patient presents to office today complaining of elongated, thickened nails that cause pain while ambulating in shoes. He is unable to trim his own nails. Patient is here for further evaluation and treatment.  No past medical history on file.  OBJECTIVE General Patient is awake, alert, and oriented x 3 and in no acute distress. Derm Skin is dry and supple bilateral. Negative open lesions or macerations. Remaining integument unremarkable. Nails are tender, long, thickened and dystrophic with subungual debris, consistent with onychomycosis, 1-5 bilateral. No signs of infection noted.  Hyperkeratotic preulcerative callus tissue to the distal tips of the toes right foot Vasc  DP and PT pedal pulses palpable bilaterally. Temperature gradient within normal limits.  Neuro Epicritic and protective threshold sensation grossly intact bilaterally.  Musculoskeletal Exam No symptomatic pedal deformities noted bilateral. Muscular strength within normal limits.  ASSESSMENT 1.  Pain due to onychomycosis of toenails above 2.  Hyperkeratotic preulcerative callus tissue distal tip of the toes right  PLAN OF CARE 1. Patient evaluated today.  2. Instructed to maintain good pedal hygiene and foot care.  3. Mechanical debridement of nails 1-5 bilaterally performed using a nail nipper. Filed with dremel without incident.  4.  Excisional debridement of the hyperkeratotic preulcerative callus tissue was performed using a 312 scalpel without incident or bleeding as a courtesy to the patient  5.  Return to clinic in 3 mos.    Afnan Cadiente M. Cythia Bachtel, DPM [facility]  Dr. Taleia Sadowski M. Coral Timme, DPM    [address]                                [place], [place] [address]                Office [phone]  Fax [phone]   

## 2021-12-09 ENCOUNTER — Ambulatory Visit: Payer: Medicare Other | Admitting: Podiatry

## 2021-12-09 DIAGNOSIS — M79675 Pain in left toe(s): Secondary | ICD-10-CM

## 2021-12-09 DIAGNOSIS — M79674 Pain in right toe(s): Secondary | ICD-10-CM | POA: Diagnosis not present

## 2021-12-09 DIAGNOSIS — B351 Tinea unguium: Secondary | ICD-10-CM

## 2021-12-09 NOTE — Progress Notes (Signed)
   SUBJECTIVE Patient presents to office today complaining of elongated, thickened nails that cause pain while ambulating in shoes. He is unable to trim his own nails. Patient is here for further evaluation and treatment.  No past medical history on file.  OBJECTIVE General Patient is awake, alert, and oriented x 3 and in no acute distress. Derm Skin is dry and supple bilateral. Negative open lesions or macerations. Remaining integument unremarkable. Nails are tender, long, thickened and dystrophic with subungual debris, consistent with onychomycosis, 1-5 bilateral. No signs of infection noted.  Hyperkeratotic preulcerative callus tissue to the distal tips of the toes right foot Vasc  DP and PT pedal pulses palpable bilaterally. Temperature gradient within normal limits.  Neuro Epicritic and protective threshold sensation grossly intact bilaterally.  Musculoskeletal Exam No symptomatic pedal deformities noted bilateral. Muscular strength within normal limits.  ASSESSMENT 1.  Pain due to onychomycosis of toenails above 2.  Hyperkeratotic preulcerative callus tissue distal tip of the toes right  PLAN OF CARE 1. Patient evaluated today.  2. Instructed to maintain good pedal hygiene and foot care.  3. Mechanical debridement of nails 1-5 bilaterally performed using a nail nipper. Filed with dremel without incident.  4.  Excisional debridement of the hyperkeratotic preulcerative callus tissue was performed using a 312 scalpel without incident or bleeding as a courtesy to the patient  5.  Return to clinic in 3 mos.    Caedin Mogan M. Leyan Branden, DPM [facility]  Dr. Breindy Meadow M. Rehema Muffley, DPM    [address]                                [place], [place] [address]                Office [phone]  Fax [phone]   

## 2021-12-16 ENCOUNTER — Ambulatory Visit: Payer: Medicare Other | Admitting: Podiatry

## 2021-12-31 ENCOUNTER — Ambulatory Visit: Payer: Medicare Other | Admitting: Family Medicine

## 2022-02-21 IMAGING — RF DG ESOPHAGUS
10 of 11 series · 13 of 24 positions shown · non-contrast
Comparison: None
COMPARISON: None

Addendum:
CLINICAL DATA: Pharyngeal dysphagia.

EXAM:
ESOPHOGRAM / BARIUM SWALLOW / BARIUM TABLET STUDY
TECHNIQUE: Combined double contrast and single contrast examination performed
using effervescent crystals, thick barium liquid, and thin barium
liquid. The patient was observed with fluoroscopy swallowing a 13 mm
barium sulphate tablet.
FLUOROSCOPY TIME:  Fluoroscopy Time:  3 minutes 54 seconds
Radiation Exposure Index (if provided by the fluoroscopic device):
42 mGy
Number of Acquired Spot Images: 0

[Series 1: cp_standard · 0.17mm/px · 1 of 1 slices shown (1 of 10)]
[im 1/1]
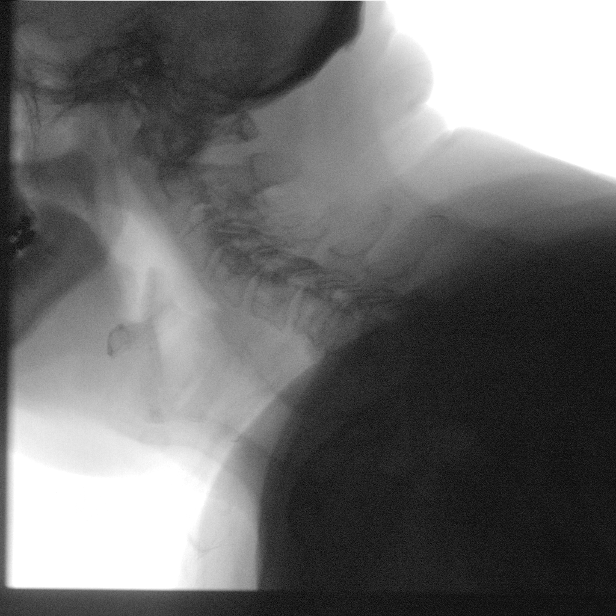

[Series 2: cp_standard · 0.17mm/px · 1 of 60 frames shown (2 of 10)]
[frame 31/60]
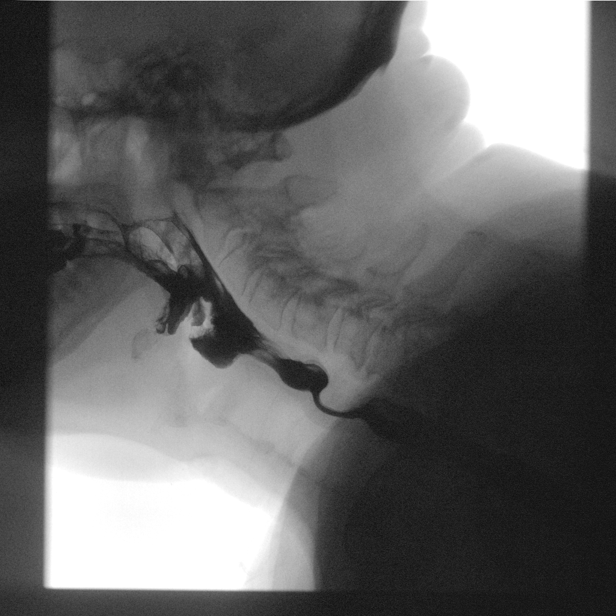

[Series 3: cp_standard · 0.17mm/px · 1 of 110 frames shown (3 of 10)]
[frame 17/110]
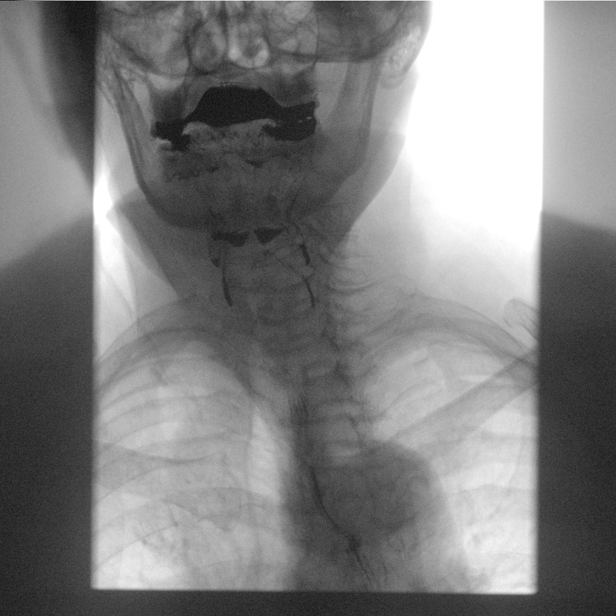

[Series 4: cp_standard · 0.26mm/px · 2 of 164 frames shown (4 of 10)]
[frame 25/164]
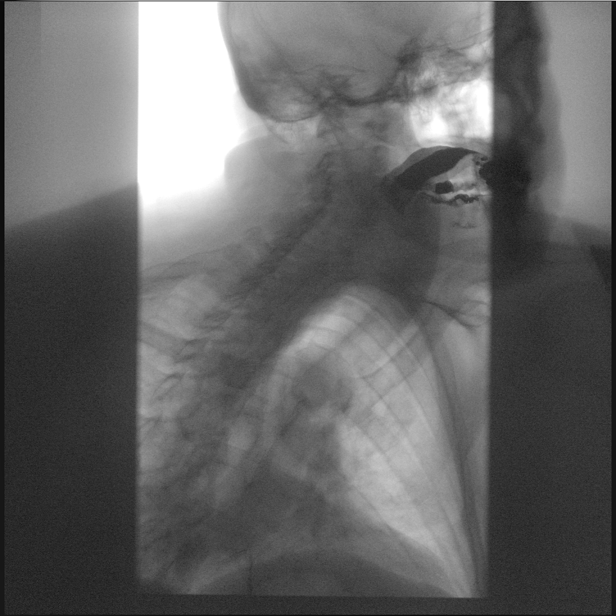
[frame 140/164]
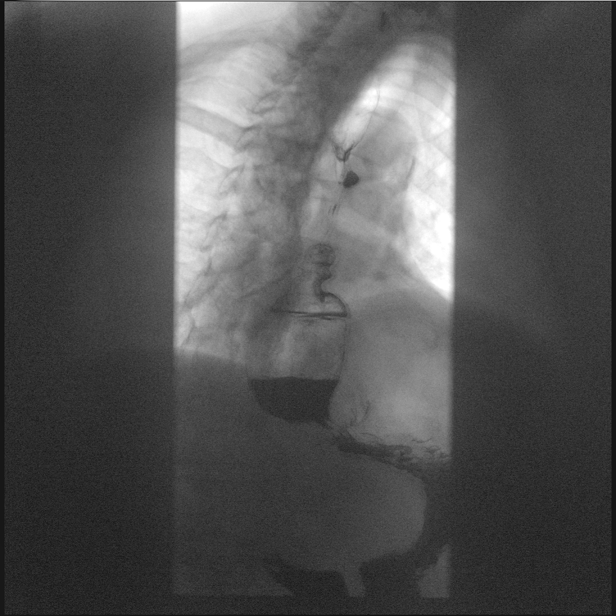

[Series 5: cp_standard · 0.26mm/px · 1 of 230 frames shown (5 of 10)]
[frame 182/230]
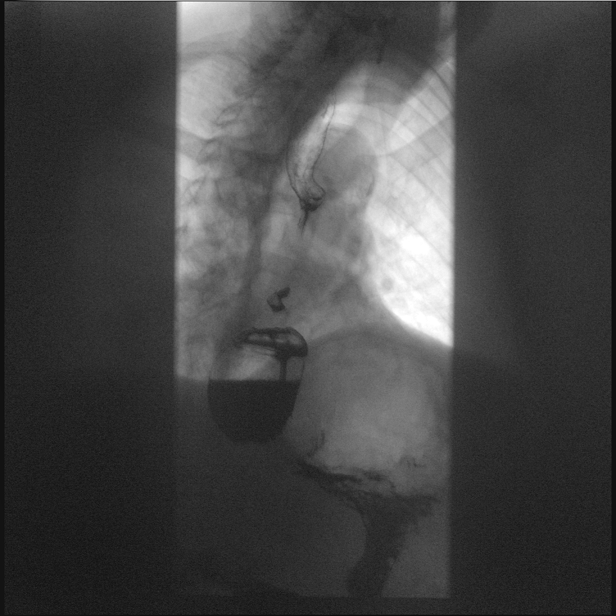

[Series 7: cp_standard · 0.26mm/px · 1 of 1 slices shown (6 of 10)]
[im 1/1]
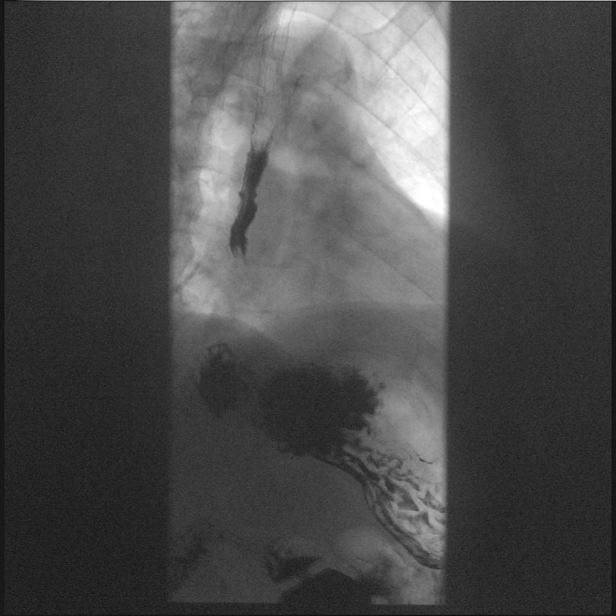

[Series 8: cp_standard · 0.26mm/px · 1 of 176 frames shown (7 of 10)]
[frame 89/176]
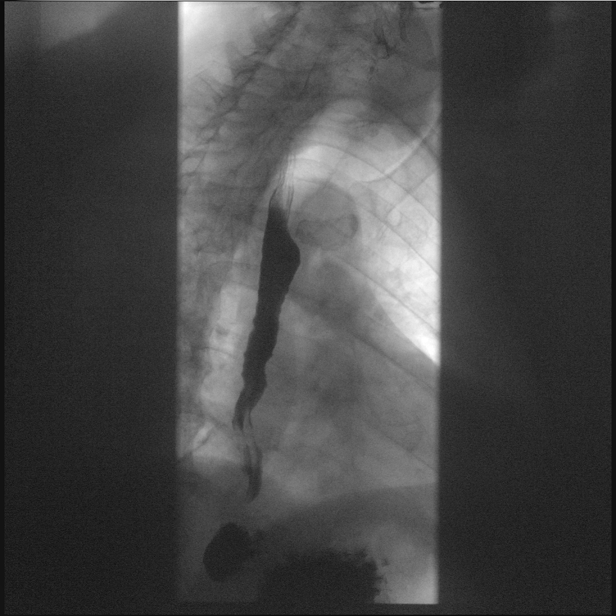

[Series 9: cp_standard · 0.26mm/px · 2 of 37 frames shown (8 of 10)]
[frame 6/37]
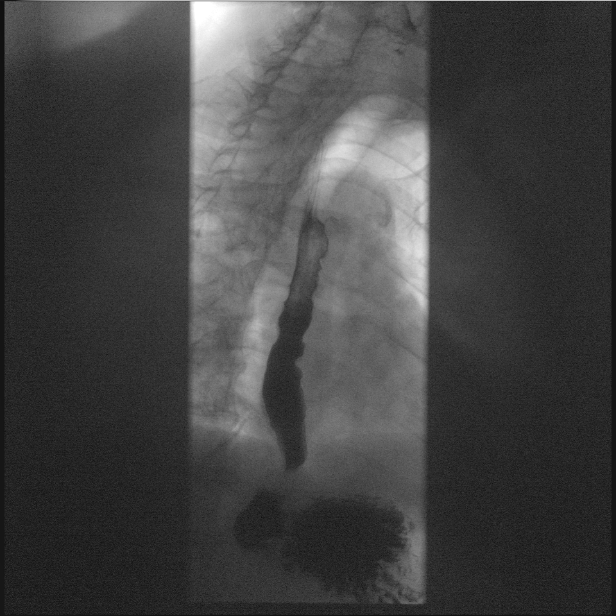
[frame 32/37]
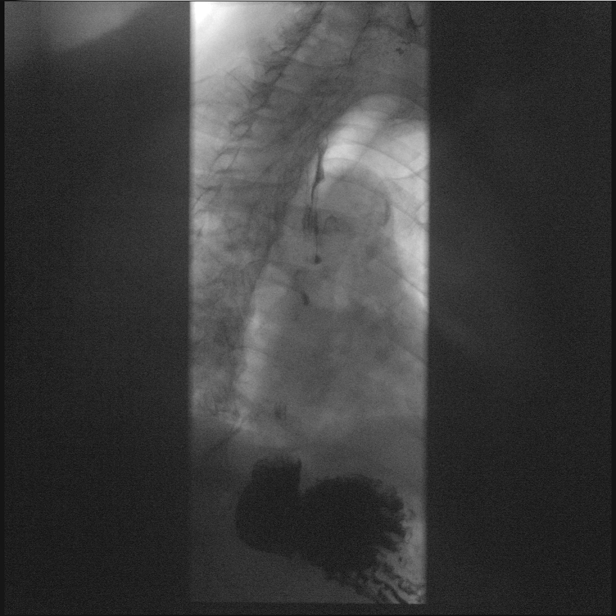

[Series 10: cp_standard · 0.26mm/px · 1 of 203 frames shown (9 of 10)]
[frame 169/203]
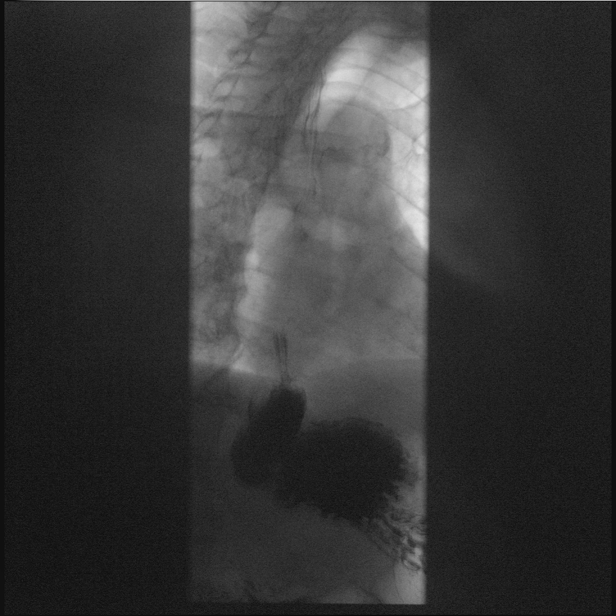

[Series 12: cp_standard · 0.26mm/px · 2 of 278 frames shown (10 of 10)]
[frame 1/278]
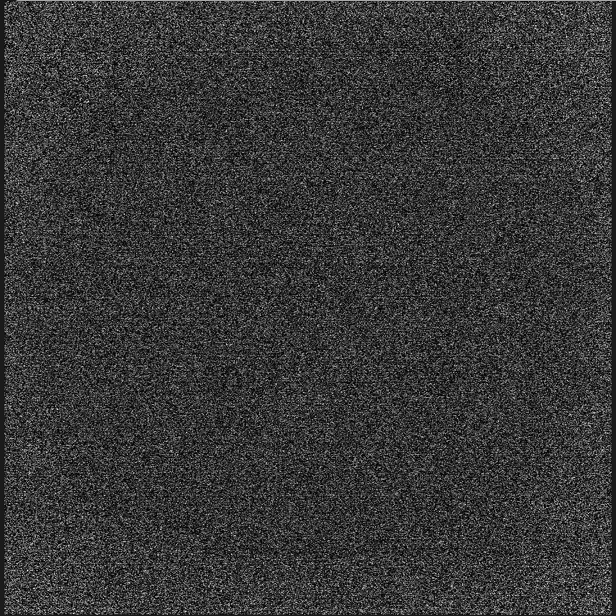
[frame 237/278]
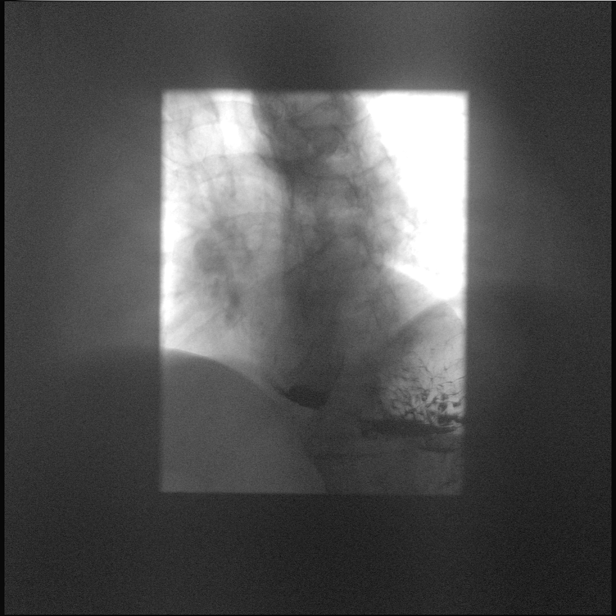

[13 of 24 positions shown; findings below may reference images not displayed]

FINDINGS: Swallowing first performed in lateral and AP projection shows large
CP bar. There is some degenerative changes noted incidentally in the
cervical spine.

Administration of effervescent crystals and thick barium shows
dysmotility of the esophagus and moderate sized hiatal hernia. Some
stasis within the hernia with suspected functional narrowing at the
level of the esophageal hiatus of the proximal stomach due to this
hiatal hernia.

Prone RAO positioning could not be performed due to limited
mobility.

Single swallow assessment shows intact primary wave and vigorous
tertiary peristaltic activity. Full column assessment with
distensibility of distal esophagus and GE junction. There is visible
emptying of the sliding type hiatal hernia.

Barium tablet was swallowed without notable difficulty, this did not
pass beyond the transition between the hiatal hernia and the more
distal stomach during observation.
IMPRESSION: Large CP bar and esophageal dysmotility without visible
gastroesophageal reflux. Findings in the esophagus suggest that
reflux may occur but was not seen on the current exam.

Moderate hiatal hernia with some functional narrowing of the
proximal stomach, emptying in real-time during full column
assessment into the distal stomach.

Pill did not pass under observation through the level of the
esophageal hiatus, again more likely related to functional narrowing
but not well assessed.

ADDENDUM:
This exam was performed by Ryady Reedwan, PA, and was supervised and
interpreted by Mico Ralls

*** End of Addendum ***
FINDINGS: Swallowing first performed in lateral and AP projection shows large
CP bar. There is some degenerative changes noted incidentally in the
cervical spine.

Administration of effervescent crystals and thick barium shows
dysmotility of the esophagus and moderate sized hiatal hernia. Some
stasis within the hernia with suspected functional narrowing at the
level of the esophageal hiatus of the proximal stomach due to this
hiatal hernia.

Prone RAO positioning could not be performed due to limited
mobility.

Single swallow assessment shows intact primary wave and vigorous
tertiary peristaltic activity. Full column assessment with
distensibility of distal esophagus and GE junction. There is visible
emptying of the sliding type hiatal hernia.

Barium tablet was swallowed without notable difficulty, this did not
pass beyond the transition between the hiatal hernia and the more
distal stomach during observation.
IMPRESSION: Large CP bar and esophageal dysmotility without visible
gastroesophageal reflux. Findings in the esophagus suggest that
reflux may occur but was not seen on the current exam.

Moderate hiatal hernia with some functional narrowing of the
proximal stomach, emptying in real-time during full column
assessment into the distal stomach.

Pill did not pass under observation through the level of the
esophageal hiatus, again more likely related to functional narrowing
but not well assessed.

## 2022-03-17 ENCOUNTER — Ambulatory Visit: Payer: Medicare Other | Admitting: Podiatry

## 2022-03-17 DIAGNOSIS — M79674 Pain in right toe(s): Secondary | ICD-10-CM | POA: Diagnosis not present

## 2022-03-17 DIAGNOSIS — M79675 Pain in left toe(s): Secondary | ICD-10-CM

## 2022-03-17 DIAGNOSIS — B351 Tinea unguium: Secondary | ICD-10-CM | POA: Diagnosis not present

## 2022-03-17 NOTE — Progress Notes (Signed)
   SUBJECTIVE Patient presents to office today complaining of elongated, thickened nails that cause pain while ambulating in shoes. He is unable to trim his own nails. Patient is here for further evaluation and treatment.  No past medical history on file.  OBJECTIVE General Patient is awake, alert, and oriented x 3 and in no acute distress. Derm Skin is dry and supple bilateral. Negative open lesions or macerations. Remaining integument unremarkable. Nails are tender, long, thickened and dystrophic with subungual debris, consistent with onychomycosis, 1-5 bilateral. No signs of infection noted.  Hyperkeratotic preulcerative callus tissue to the distal tips of the toes right foot Vasc  DP and PT pedal pulses palpable bilaterally. Temperature gradient within normal limits.  Neuro Epicritic and protective threshold sensation grossly intact bilaterally.  Musculoskeletal Exam No symptomatic pedal deformities noted bilateral. Muscular strength within normal limits.  ASSESSMENT 1.  Pain due to onychomycosis of toenails above 2.  Hyperkeratotic preulcerative callus tissue distal tip of the toes right  PLAN OF CARE 1. Patient evaluated today.  2. Instructed to maintain good pedal hygiene and foot care.  3. Mechanical debridement of nails 1-5 bilaterally performed using a nail nipper. Filed with dremel without incident.  4.  Excisional debridement of the hyperkeratotic preulcerative callus tissue was performed using a 312 scalpel without incident or bleeding as a courtesy to the patient  5.  Return to clinic in 3 mos.    Zacharius Funari M. Thurlow Gallaga, DPM [facility]  Dr. Kelsea Mousel M. Brittany Osier, DPM    [address]                                [place], [place] [address]                Office [phone]  Fax [phone]   

## 2022-06-16 ENCOUNTER — Ambulatory Visit: Payer: Medicare Other | Admitting: Podiatry

## 2022-06-16 ENCOUNTER — Encounter: Payer: Self-pay | Admitting: Podiatry

## 2022-06-16 DIAGNOSIS — M79674 Pain in right toe(s): Secondary | ICD-10-CM

## 2022-06-16 DIAGNOSIS — B351 Tinea unguium: Secondary | ICD-10-CM | POA: Diagnosis not present

## 2022-06-16 DIAGNOSIS — M79675 Pain in left toe(s): Secondary | ICD-10-CM | POA: Diagnosis not present

## 2022-06-16 NOTE — Progress Notes (Signed)
   SUBJECTIVE Patient presents to office today complaining of elongated, thickened nails that cause pain while ambulating in shoes. He is unable to trim his own nails. Patient is here for further evaluation and treatment.  No past medical history on file.  OBJECTIVE General Patient is awake, alert, and oriented x 3 and in no acute distress. Derm Skin is dry and supple bilateral. Negative open lesions or macerations. Remaining integument unremarkable. Nails are tender, long, thickened and dystrophic with subungual debris, consistent with onychomycosis, 1-5 bilateral. No signs of infection noted.  Hyperkeratotic preulcerative callus tissue to the distal tips of the toes right foot Vasc  DP and PT pedal pulses palpable bilaterally. Temperature gradient within normal limits.  Neuro Epicritic and protective threshold sensation grossly intact bilaterally.  Musculoskeletal Exam No symptomatic pedal deformities noted bilateral. Muscular strength within normal limits.  ASSESSMENT 1.  Pain due to onychomycosis of toenails above 2.  Hyperkeratotic preulcerative callus tissue distal tip of the toes right  PLAN OF CARE 1. Patient evaluated today.  2. Instructed to maintain good pedal hygiene and foot care.  3. Mechanical debridement of nails 1-5 bilaterally performed using a nail nipper. Filed with dremel without incident.  4.  Excisional debridement of the hyperkeratotic preulcerative callus tissue was performed using a 312 scalpel without incident or bleeding as a courtesy to the patient  5.  Return to clinic in 3 mos.    Edrick Kins, DPM [facility]  Dr. Edrick Kins, DPM    [address], [place] [address]                Office [phone]  Fax [phone]

## 2022-09-15 ENCOUNTER — Ambulatory Visit: Payer: Medicare Other | Admitting: Podiatry

## 2022-09-15 DIAGNOSIS — M79674 Pain in right toe(s): Secondary | ICD-10-CM

## 2022-09-15 DIAGNOSIS — M79675 Pain in left toe(s): Secondary | ICD-10-CM | POA: Diagnosis not present

## 2022-09-15 DIAGNOSIS — B351 Tinea unguium: Secondary | ICD-10-CM

## 2022-09-15 NOTE — Progress Notes (Signed)
   Chief Complaint  Patient presents with   Nail Problem    Routine foot care, nail trim, nails are thick and yellow,     SUBJECTIVE Patient presents to office today complaining of elongated, thickened nails that cause pain while ambulating in shoes.  Patient is unable to trim their own nails. Patient is here for further evaluation and treatment.  No past medical history on file.  No Known Allergies   OBJECTIVE General Patient is awake, alert, and oriented x 3 and in no acute distress. Derm Skin is dry and supple bilateral. Negative open lesions or macerations. Remaining integument unremarkable. Nails are tender, long, thickened and dystrophic with subungual debris, consistent with onychomycosis, 1-5 bilateral. No signs of infection noted. Vasc  DP and PT pedal pulses palpable bilaterally. Temperature gradient within normal limits.  Neuro Epicritic and protective threshold sensation grossly intact bilaterally.  Musculoskeletal Exam No symptomatic pedal deformities noted bilateral. Muscular strength within normal limits.  ASSESSMENT 1.  Pain due to onychomycosis of toenails both  PLAN OF CARE 1. Patient evaluated today.  2. Instructed to maintain good pedal hygiene and foot care.  3. Mechanical debridement of nails 1-5 bilaterally performed using a nail nipper. Filed with dremel without incident.  4. Return to clinic in 3 mos.    Felecia Shelling, DPM [facility]  Dr. Felecia Shelling, DPM    [address] [place], [place] [address]                Office [phone]  Fax [phone]

## 2023-01-05 ENCOUNTER — Encounter: Payer: Self-pay | Admitting: Podiatry

## 2023-01-05 ENCOUNTER — Ambulatory Visit: Payer: Medicare Other | Admitting: Podiatry

## 2023-01-05 VITALS — BP 144/60 | HR 69

## 2023-01-05 DIAGNOSIS — M79675 Pain in left toe(s): Secondary | ICD-10-CM

## 2023-01-05 DIAGNOSIS — B351 Tinea unguium: Secondary | ICD-10-CM | POA: Diagnosis not present

## 2023-01-05 DIAGNOSIS — M79674 Pain in right toe(s): Secondary | ICD-10-CM | POA: Diagnosis not present

## 2023-01-05 NOTE — Progress Notes (Signed)
   Chief Complaint  Patient presents with   Nail Problem    "He's getting his nails trimmed."    SUBJECTIVE Patient presents to office today complaining of elongated, thickened nails that cause pain while ambulating in shoes.  Patient is unable to trim their own nails. Patient is here for further evaluation and treatment.  No past medical history on file.  No Known Allergies   OBJECTIVE General Patient is awake, alert, and oriented x 3 and in no acute distress. Derm Skin is dry and supple bilateral. Negative open lesions or macerations. Remaining integument unremarkable. Nails are tender, long, thickened and dystrophic with subungual debris, consistent with onychomycosis, 1-5 bilateral. No signs of infection noted. Vasc  DP and PT pedal pulses palpable bilaterally. Temperature gradient within normal limits.  Neuro Epicritic and protective threshold sensation grossly intact bilaterally.  Musculoskeletal Exam No symptomatic pedal deformities noted bilateral. Muscular strength within normal limits.  ASSESSMENT 1.  Pain due to onychomycosis of toenails both  PLAN OF CARE 1. Patient evaluated today.  2. Instructed to maintain good pedal hygiene and foot care.  3. Mechanical debridement of nails 1-5 bilaterally performed using a nail nipper. Filed with dremel without incident.  4. Return to clinic in 3 mos.    Felecia Shelling, DPM [facility]  Dr. Felecia Shelling, DPM    [address] [place], [place] [address]                Office [phone]  Fax [phone]

## 2023-01-12 ENCOUNTER — Ambulatory Visit: Payer: Medicare Other | Admitting: Podiatry

## 2023-02-12 DIAGNOSIS — F0280 Dementia in other diseases classified elsewhere without behavioral disturbance: Secondary | ICD-10-CM | POA: Insufficient documentation

## 2023-02-12 DIAGNOSIS — G309 Alzheimer's disease, unspecified: Secondary | ICD-10-CM | POA: Insufficient documentation

## 2023-02-12 DIAGNOSIS — F03918 Unspecified dementia, unspecified severity, with other behavioral disturbance: Secondary | ICD-10-CM | POA: Insufficient documentation

## 2023-04-06 ENCOUNTER — Ambulatory Visit: Payer: Medicare Other | Admitting: Podiatry

## 2023-04-06 ENCOUNTER — Encounter: Payer: Self-pay | Admitting: Podiatry

## 2023-04-06 DIAGNOSIS — B351 Tinea unguium: Secondary | ICD-10-CM | POA: Diagnosis not present

## 2023-04-06 DIAGNOSIS — M79675 Pain in left toe(s): Secondary | ICD-10-CM | POA: Diagnosis not present

## 2023-04-06 DIAGNOSIS — M79674 Pain in right toe(s): Secondary | ICD-10-CM | POA: Diagnosis not present

## 2023-04-06 NOTE — Progress Notes (Signed)
   Chief Complaint  Patient presents with   Nail Problem    "Give him a little toe trim."    SUBJECTIVE Patient presents to office today complaining of elongated, thickened nails that cause pain while ambulating in shoes.  Patient is unable to trim their own nails. Patient is here for further evaluation and treatment.  No past medical history on file.  No Known Allergies   OBJECTIVE General Patient is awake, alert, and oriented x 3 and in no acute distress. Derm Skin is dry and supple bilateral. Negative open lesions or macerations. Remaining integument unremarkable. Nails are tender, long, thickened and dystrophic with subungual debris, consistent with onychomycosis, 1-5 bilateral. No signs of infection noted. Vasc  DP and PT pedal pulses palpable bilaterally. Temperature gradient within normal limits.  Neuro Epicritic and protective threshold sensation grossly intact bilaterally.  Musculoskeletal Exam No symptomatic pedal deformities noted bilateral. Muscular strength within normal limits.  ASSESSMENT 1.  Pain due to onychomycosis of toenails both  PLAN OF CARE 1. Patient evaluated today.  2. Instructed to maintain good pedal hygiene and foot care.  3. Mechanical debridement of nails 1-5 bilaterally performed using a nail nipper. Filed with dremel without incident.  4. Return to clinic in 3 mos.    Felecia Shelling, DPM [facility]  Dr. Felecia Shelling, DPM    [address] [place], [place] [address]                Office [phone]  Fax [phone]

## 2023-04-26 ENCOUNTER — Emergency Department: Payer: Medicare Other

## 2023-04-26 ENCOUNTER — Other Ambulatory Visit: Payer: Self-pay

## 2023-04-26 ENCOUNTER — Emergency Department
Admission: EM | Admit: 2023-04-26 | Discharge: 2023-04-26 | Disposition: A | Discharge status: Home / Self Care | Payer: Medicare Other | Attending: Emergency Medicine | Admitting: Emergency Medicine

## 2023-04-26 DIAGNOSIS — J028 Acute pharyngitis due to other specified organisms: Secondary | ICD-10-CM

## 2023-04-26 DIAGNOSIS — U071 COVID-19: Secondary | ICD-10-CM

## 2023-04-26 DIAGNOSIS — B9721 SARS-associated coronavirus as the cause of diseases classified elsewhere: Secondary | ICD-10-CM | POA: Insufficient documentation

## 2023-04-26 DIAGNOSIS — J029 Acute pharyngitis, unspecified: Secondary | ICD-10-CM | POA: Diagnosis present

## 2023-04-26 LAB — BASIC METABOLIC PANEL
Anion gap: 11 (ref 5–15)
BUN: 16 mg/dL (ref 8–23)
CO2: 29 mmol/L (ref 22–32)
Calcium: 8.9 mg/dL (ref 8.9–10.3)
Chloride: 97 mmol/L — ABNORMAL LOW (ref 98–111)
Creatinine, Ser: 0.67 mg/dL (ref 0.61–1.24)
GFR, Estimated: >60 mL/min (ref >60)
Glucose, Bld: 132 mg/dL — ABNORMAL HIGH (ref 70–99)
Potassium: 3.7 mmol/L (ref 3.5–5.1)
Sodium: 137 mmol/L (ref 135–145)

## 2023-04-26 LAB — CBC
HCT: 40.7 % (ref 39.0–52.0)
Hemoglobin: 13.5 g/dL (ref 13.0–17.0)
MCH: 32.7 pg (ref 26.0–34.0)
MCHC: 33.2 g/dL (ref 30.0–36.0)
MCV: 98.5 fL (ref 80.0–100.0)
Platelets: 268 10*3/uL (ref 150–400)
RBC: 4.13 MIL/uL — ABNORMAL LOW (ref 4.22–5.81)
RDW: 13.4 % (ref 11.5–15.5)
WBC: 9.4 10*3/uL (ref 4.0–10.5)
nRBC: 0 % (ref 0.0–0.2)

## 2023-04-26 MED ORDER — NIRMATRELVIR/RITONAVIR (PAXLOVID)TABLET: 3.0000 | ORAL_TABLET | Freq: Two times a day (BID) | ORAL | 0 refills | Status: AC

## 2023-04-26 MED ORDER — LIDOCAINE VISCOUS HCL 2 % MT SOLN: 15.0000 mL | Freq: Four times a day (QID) | OROMUCOSAL | 0 refills | Status: AC | PRN

## 2023-04-26 NOTE — ED Triage Notes (Signed)
 First nurse note: pt to ED [facility] from [facility] for COVID + since Saturday. Reports decreased oral intake d/t sore throat.  Dementia at baseline.

## 2023-04-26 NOTE — ED Triage Notes (Signed)
 Pt sts that he was dx with COVID on 01/04. Pt sts that he has been having a sore throat and not able to swallow as the pain hurts to much. Daughter sts that he has been coughing also.

## 2023-04-26 NOTE — ED Provider Triage Note (Signed)
 Emergency Medicine Provider Triage Evaluation Note  Shepherd Finnan , a 89 y.o. male  was evaluated in triage.  Pt complains of sore throat and cough. Covid positive on 04/24/2023.   Physical Exam  BP (!) 153/93 (BP Location: Left Arm)   Pulse 70   Temp 98.8 F (37.1 C) (Oral)   Resp 16   Ht 5' 4 (1.626 m)   Wt 72.6 kg   SpO2 95%   BMI 27.46 kg/m  Gen:   Awake, no distress   Resp:  Normal effort  MSK:   Moves extremities without difficulty  Other:    Medical Decision Making  Medically screening exam initiated at 12:44 PM.  Appropriate orders placed.  Dallas Schillings was informed that the remainder of the evaluation will be completed by another provider, this initial triage assessment does not replace that evaluation, and the importance of remaining in the ED until their evaluation is complete.  Labs and chest x-ray ordered.   Herlinda Kirk NOVAK, FNP 04/26/23 1253

## 2023-04-26 NOTE — ED Notes (Signed)
 See triage notes. Patient is COVID positive. Patient's nurse is concerned due to hearing noisy breathing. Patient also c/o sore throat and not wanting/being able to swallow well.

## 2023-04-26 NOTE — ED Notes (Signed)
 Patient was found to have wet thru his depends and underwear. Patient was cleaned and a dry adult diaper was placed.

## 2023-05-04 NOTE — ED Provider Notes (Signed)
 [facility] Provider Note    Event Date/Time   First MD Initiated Contact with Patient 04/26/23 1404     (approximate)   History   Sore Throat   HPI  Levi Wilkinson is a 89 y.o. male who presents with complaints of sore throat, known to be COVID-positive.  He reports decreased p.o. intake but is able to eat and drink but at times it can be uncomfortable.  No shortness of breath     Physical Exam   Triage Vital Signs: ED Triage Vitals  Encounter Vitals Group     BP 04/26/23 1235 (!) 153/93     Systolic BP Percentile --      Diastolic BP Percentile --      Pulse Rate 04/26/23 1235 70     Resp 04/26/23 1235 16     Temp 04/26/23 1235 98.8 F (37.1 C)     Temp Source 04/26/23 1235 Oral     SpO2 04/26/23 1235 95 %     Weight 04/26/23 1235 72.6 kg (160 lb)     Height 04/26/23 1235 1.626 m (5' 4)     Head Circumference --      Peak Flow --      Pain Score 04/26/23 1240 6     Pain Loc --      Pain Education --      Exclude from Growth Chart --     Most recent vital signs: Vitals:   04/26/23 1235  BP: (!) 153/93  Pulse: 70  Resp: 16  Temp: 98.8 F (37.1 C)  SpO2: 95%     General: Awake, no distress.  CV:  Good peripheral perfusion.  Resp:  Normal effort.  Abd:  No distention.  Other:  Pharynx with mild erythema, no significant swelling   ED Results / Procedures / Treatments   Labs (all labs ordered are listed, but only abnormal results are displayed) Labs Reviewed  CBC - Abnormal; Notable for the following components:      Result Value   RBC 4.13 (*)    All other components within normal limits  BASIC METABOLIC PANEL - Abnormal; Notable for the following components:   Chloride 97 (*)    Glucose, Bld 132 (*)    All other components within normal limits     EKG     RADIOLOGY X-ray viewed interpret by me, no acute abnormality    PROCEDURES:  Critical Care performed:   Procedures   MEDICATIONS ORDERED IN  ED: Medications - No data to display   IMPRESSION / MDM / ASSESSMENT AND PLAN / ED COURSE  I reviewed the triage vital signs and the nursing notes. Patient's presentation is most consistent with acute illness / injury with system symptoms.  Patient with known COVID presents with pharyngitis, likely related to the COVID.  Exam is overall reassuring, blood work is unremarkable.  Chest x-ray without acute pneumonia.  Will start the patient on COVID treatment, viscous lidocaine as needed for throat pain        FINAL CLINICAL IMPRESSION(S) / ED DIAGNOSES   Final diagnoses:  COVID-19  Acute pharyngitis due to other specified organisms     Rx / DC Orders   ED Discharge Orders          Ordered    nirmatrelvir/ritonavir (PAXLOVID) 20 x 150 MG & 10 x 100MG  TABS  2 times daily        04/26/23 1437    lidocaine (XYLOCAINE)  2 % solution  Every 6 hours PRN        04/26/23 1437             Note:  This document was prepared using Dragon voice recognition software and may include unintentional dictation errors.   Arlander Charleston, MD 05/04/23 1505

## 2023-06-22 ENCOUNTER — Ambulatory Visit: Payer: Medicare Other | Admitting: Podiatry

## 2023-06-22 ENCOUNTER — Encounter: Payer: Self-pay | Admitting: Podiatry

## 2023-06-22 DIAGNOSIS — M79674 Pain in right toe(s): Secondary | ICD-10-CM | POA: Diagnosis not present

## 2023-06-22 DIAGNOSIS — M79675 Pain in left toe(s): Secondary | ICD-10-CM | POA: Diagnosis not present

## 2023-06-22 DIAGNOSIS — B351 Tinea unguium: Secondary | ICD-10-CM

## 2023-06-22 NOTE — Progress Notes (Signed)
   Chief Complaint  Patient presents with   Wound Check    "They said he has sores on his feet that need to be looked at."    SUBJECTIVE Patient presents to office today complaining of elongated, thickened nails that cause pain while ambulating in shoes.  Patient is unable to trim their own nails. Patient is here for further evaluation and treatment.  No past medical history on file.  No Known Allergies   OBJECTIVE General Patient is awake, alert, and oriented x 3 and in no acute distress. Derm Skin is dry and supple bilateral. Negative open lesions or macerations. Remaining integument unremarkable. Nails are tender, long, thickened and dystrophic with subungual debris, consistent with onychomycosis, 1-5 bilateral. No signs of infection noted. Vasc  DP and PT pedal pulses palpable bilaterally. Temperature gradient within normal limits.  Neuro Epicritic and protective threshold sensation grossly intact bilaterally.  Musculoskeletal Exam No symptomatic pedal deformities noted bilateral. Muscular strength within normal limits.  ASSESSMENT 1.  Pain due to onychomycosis of toenails both  PLAN OF CARE 1. Patient evaluated today.  2. Instructed to maintain good pedal hygiene and foot care.  3. Mechanical debridement of nails 1-5 bilaterally performed using a nail nipper. Filed with dremel without incident.  4. Return to clinic in 3 mos.    Felecia Shelling, DPM [facility]  Dr. Felecia Shelling, DPM    [address] [place], [place] [address]                Office [phone]  Fax [phone]

## 2023-07-05 ENCOUNTER — Encounter: Payer: Self-pay | Admitting: Student

## 2023-07-05 ENCOUNTER — Non-Acute Institutional Stay: Admitting: Student

## 2023-07-05 DIAGNOSIS — G301 Alzheimer's disease with late onset: Secondary | ICD-10-CM | POA: Diagnosis not present

## 2023-07-05 DIAGNOSIS — F0280 Dementia in other diseases classified elsewhere without behavioral disturbance: Secondary | ICD-10-CM

## 2023-07-05 DIAGNOSIS — N400 Enlarged prostate without lower urinary tract symptoms: Secondary | ICD-10-CM

## 2023-07-05 DIAGNOSIS — I10 Essential (primary) hypertension: Secondary | ICD-10-CM

## 2023-07-05 DIAGNOSIS — H353133 Nonexudative age-related macular degeneration, bilateral, advanced atrophic without subfoveal involvement: Secondary | ICD-10-CM | POA: Diagnosis not present

## 2023-07-05 DIAGNOSIS — R7303 Prediabetes: Secondary | ICD-10-CM

## 2023-07-05 DIAGNOSIS — Z66 Do not resuscitate: Secondary | ICD-10-CM

## 2023-07-05 NOTE — Progress Notes (Signed)
 Location:  Other Nursing Home Room Number: [facility] of Service:  ALF [phone]) Provider:  Ander Gaster, Benetta Spar, MD  Patient Care Team: Earnestine Mealing, MD as PCP - General (Family Medicine)  Extended Emergency Contact Information Primary Emergency Contact: Gillihan,Martie Mobile Phone: [phone] Relation: Spouse Secondary Emergency Contact: Hodapp,martie Mobile Phone: [phone] Relation: Spouse  Code Status:  DNR Goals of care: Advanced Directive information    04/26/2023   12:41 PM  Advanced Directives  Does Patient Have a Medical Advance Directive? No     Chief Complaint  Patient presents with   Medical Management of Chronic Issues    HPI:  Pt is a 89 y.o. male seen today for a routine visit to establish care.  Discussed the use of AI scribe software for clinical note transcription with the patient, who gave verbal consent to proceed.  History of Present Illness The patient, with a history of skin cancer and dementia, presents with concerns about skin cancer and memory changes. He is accompanied by his spouse, who is his primary caregiver.  He has a history of skin cancer, particularly on his face, and currently has a couple of spots that need evaluation. He has previously seen a visiting dermatologist on campus and has had Mohs surgery at [facility] for a spot on his left cheek. His spouse is concerned about a spot on the left cheek that may require more than just freezing, as he has been picking at it.  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 His spouse reports that these changes influenced his decision to move to a community living setting. He has been diagnosed with dementia, and his spouse describes him as being in the middle stages, requiring supervision for daily activities such as dressing and toileting. He has mood swings, particularly in the evening, and  takes Cymbalta 60 mg twice daily and Seroquel 50-75 mg nightly for mood stabilization.  He has a history of macular degeneration, with the dry form affecting both eyes, more severely in the left eye. He previously saw a retina specialist but stopped due to long wait times and lack of treatment options. He experiences 'wavy lines' and a 'big black spot' in his vision, particularly affecting reading.  He has mobility issues, exacerbated by a knee problem, and has undergone physical therapy. He no longer follows a home exercise program due to knee pain and limited progress. He requires assistance with dressing, particularly with pants, to prevent falls.  He has been on oxybutynin for about two years for bladder issues, but his spouse notes he is a 'soda addict,' which may have contributed to urinary frequency. He drinks zero sugar sodas and cranberry juice.  He and his spouse both had COVID around Christmas, which led to an emergency room visit due to his inability to get up. He was subsequently moved to the current facility, where he was in isolation for three weeks.  History reviewed. No pertinent past medical history. History reviewed. No pertinent surgical history.  No Known Allergies  Outpatient Encounter Medications as of 07/05/2023  Medication Sig   acetaminophen (TYLENOL) 500 MG tablet Take by mouth.   atorvastatin (LIPITOR) 20 MG tablet Take by mouth.   DULoxetine (CYMBALTA) 60 MG capsule    DULoxetine (CYMBALTA) 60 MG capsule Take by mouth.   ergocalciferol (VITAMIN D2) 50000 units capsule Take by mouth.   ketoconazole (NIZORAL) 2 % shampoo Wash scalp  3-4 times weekly. Let sit for 5 minutes then wash out   [DISCONTINUED] aspirin EC 81 MG tablet Take by mouth. (Patient not taking: Reported on 06/16/2022)   [DISCONTINUED] calcitonin, salmon, (MIACALCIN/FORTICAL) 200 UNIT/ACT nasal spray Place into the nose. (Patient not taking: Reported on 06/16/2022)   [DISCONTINUED] diazepam (VALIUM) 5  MG tablet  (Patient not taking: Reported on 06/16/2022)   [DISCONTINUED] diclofenac sodium (VOLTAREN) 1 % GEL  (Patient not taking: Reported on 06/16/2022)   [DISCONTINUED] finasteride (PROSCAR) 5 MG tablet  (Patient not taking: Reported on 06/22/2023)   [DISCONTINUED] ibandronate (BONIVA) 150 MG tablet Take 150 mg by mouth every 30 (thirty) days. (Patient not taking: Reported on 06/16/2022)   [DISCONTINUED] methocarbamol (ROBAXIN) 500 MG tablet  (Patient not taking: Reported on 06/16/2022)   [DISCONTINUED] ondansetron (ZOFRAN-ODT) 8 MG disintegrating tablet Take 8 mg by mouth every 8 (eight) hours as needed. for nausea (Patient not taking: Reported on 06/16/2022)   [DISCONTINUED] oxyCODONE (OXY IR/ROXICODONE) 5 MG immediate release tablet  (Patient not taking: Reported on 06/16/2022)   [DISCONTINUED] sertraline (ZOLOFT) 50 MG tablet  (Patient not taking: Reported on 06/16/2022)   [DISCONTINUED] traMADol (ULTRAM) 50 MG tablet Take 50 mg by mouth every 6 (six) hours as needed. for pain (Patient not taking: Reported on 06/16/2022)   No facility-administered encounter medications on file as of 07/05/2023.    Review of Systems  Immunization History  Administered Date(s) Administered   Fluad Quad(high Dose 65+) 12/31/2018   Influenza, High Dose Seasonal PF 12/26/2014, 01/12/2017, 02/13/2018   Influenza-Unspecified 01/20/2014, 02/15/2014, 12/26/2014, 11/19/2015, 11/29/2015, 01/12/2017   Moderna Sars-Covid-2 Vaccination 05/05/2019   Pneumococcal Conjugate-13 01/18/2014, 02/15/2014   Pneumococcal Polysaccharide-23 09/10/2009   Tdap 01/26/2013   Zoster Recombinant(Shingrix) 11/16/2017, 01/21/2018   Zoster, Live 02/18/2010   Pertinent  Health Maintenance Due  Topic Date Due   INFLUENZA VACCINE  Completed       No data to display         Functional Status Survey:    Vitals:   07/05/23 2007  BP: 138/67  Pulse: 61  Temp: 98.8 F (37.1 C)  SpO2: 97%  Weight: 166 lb 6.4 oz (75.5 kg)   Body  mass index is 28.56 kg/m. Physical Exam  Physical Exam CHEST: Lungs clear to auscultation. CARDIOVASCULAR: Heart regular rate and rhythm, no murmurs.  Results LABS LDL: 103 mg/dL [id number]: 1.9% GFR: 90 JY/NWG/9.56O Creatinine: 0.7 mg/dL Hemoglobin: 13.0 g/dL  Labs reviewed: Recent Labs    04/26/23 1242  NA 137  K 3.7  CL 97*  CO2 29  GLUCOSE 132*  BUN 16  CREATININE 0.67  CALCIUM 8.9   No results for input(s): "AST", "ALT", "ALKPHOS", "BILITOT", "PROT", "ALBUMIN" in the last 8760 hours. Recent Labs    04/26/23 1242  WBC 9.4  HGB 13.5  HCT 40.7  MCV 98.5  PLT 268   No results found for: "TSH" No results found for: "HGBA1C" No results found for: "CHOL", "HDL", "LDLCALC", "LDLDIRECT", "TRIG", "CHOLHDL"  Significant Diagnostic Results in last 30 days:  No results found.  Assessment/Plan Hx of Skin Cancer Current concerns about two spots on each cheek, with the left cheek lesion potentially requiring more than cryotherapy due to progression. Previous Mohs surgery on the left cheek. Dermatology follow-up is necessary to assess the lesions. - Coordinate with Gita Kudo for the visiting dermatologist's evaluation of the skin lesions. - Consider alternative dermatology appointment if necessary. Derm to see patient on 3/7  Macular Degeneration Dry macular degeneration in  both eyes, with the left eye more severely affected. Discontinued retina specialist visits due to lack of treatment options and long wait times. Vision changes include metamorphopsia and a scotoma, affecting reading ability.  Dementia Memory changes began approximately seven years ago, now progressed to stage 6 dementia. Symptoms include incontinence, need for supervision with daily activities, and sundowning. Life expectancy post-memory changes is up to ten years, often around eight. Patient has remained stable after nearly 6 months transition.  - Continue Cymbalta and Seroquel, considering risks  and benefits, including potential cardiac risks and sedation. Seroquel has a black box warning for increased mortality in those over 65. - Adjust care as needed for further cognitive decline.  Urinary Incontinence Managed with oxybutynin, which has anticholinergic side effects contributing to confusion. History of soda consumption may exacerbate symptoms. Oxybutynin has a higher side effect profile compared to newer alternatives. - Discontinue oxybutynin. - Initiate Gemtesa for overactive bladder management.  Advance Care Planning   Goals of Care Prioritize quality of life and comfort, avoiding extreme measures. No interest in resuscitation or hospital transfer for acute illnesses, preferring in-facility management to maintain comfort and align with values. - Complete advance directives to document preferences against resuscitation and hospital transfer. - Ensure care team is aware of goals of care and preferences for in-facility management.    Family/ staff Communication: spouse,   Labs/tests ordered:  q24mo bmp and cbc  I spent greater than 60  minutes for the care of this patient in face to face time, chart review, clinical documentation, patient education. I spent an additional 16 minutes discussing goals of care and advanced care planning.

## 2023-07-27 ENCOUNTER — Ambulatory Visit: Payer: Medicare Other | Admitting: Podiatry

## 2023-08-03 ENCOUNTER — Ambulatory Visit: Payer: Medicare Other | Admitting: Podiatry

## 2023-09-28 ENCOUNTER — Encounter: Payer: Self-pay | Admitting: Student

## 2023-10-05 ENCOUNTER — Non-Acute Institutional Stay: Admitting: Nurse Practitioner

## 2023-10-05 ENCOUNTER — Encounter: Payer: Self-pay | Admitting: Nurse Practitioner

## 2023-10-05 DIAGNOSIS — N400 Enlarged prostate without lower urinary tract symptoms: Secondary | ICD-10-CM

## 2023-10-05 DIAGNOSIS — K5901 Slow transit constipation: Secondary | ICD-10-CM

## 2023-10-05 DIAGNOSIS — I10 Essential (primary) hypertension: Secondary | ICD-10-CM | POA: Diagnosis not present

## 2023-10-05 DIAGNOSIS — F419 Anxiety disorder, unspecified: Secondary | ICD-10-CM

## 2023-10-05 DIAGNOSIS — F03918 Unspecified dementia, unspecified severity, with other behavioral disturbance: Secondary | ICD-10-CM

## 2023-10-05 DIAGNOSIS — H353133 Nonexudative age-related macular degeneration, bilateral, advanced atrophic without subfoveal involvement: Secondary | ICD-10-CM | POA: Diagnosis not present

## 2023-10-05 DIAGNOSIS — R7303 Prediabetes: Secondary | ICD-10-CM

## 2023-10-05 DIAGNOSIS — E785 Hyperlipidemia, unspecified: Secondary | ICD-10-CM

## 2023-10-05 NOTE — Assessment & Plan Note (Signed)
 Continues on preservision and supportive care from family and staff

## 2023-10-05 NOTE — Assessment & Plan Note (Signed)
 Blood pressure well controlled, goal bp <140/90 Continue current medications and dietary modifications follow metabolic panel

## 2023-10-05 NOTE — Assessment & Plan Note (Signed)
 Stable on cymbalta- consider GDR

## 2023-10-05 NOTE — Progress Notes (Signed)
 Location:  Other [place].  Nursing Home Room Number: [facility] 101P Place of Service:  ALF [phone]) Gilbert Lab, NP  PCP: Valrie Gehrig, MD  Patient Care Team: Valrie Gehrig, MD as PCP - General (Family Medicine)  Extended Emergency Contact Information Primary Emergency Contact: Aulds,Martie Mobile Phone: [phone] Relation: Spouse Secondary Emergency Contact: Hallstrom,martie Mobile Phone: [phone] Relation: Spouse  Goals of care: Advanced Directive information    04/26/2023   12:41 PM  Advanced Directives  Does Patient Have a Medical Advance Directive? No     No chief complaint on file.   HPI:  Pt is a 89 y.o. male seen today for medical management of chronic disease.  Pt has been in memory care since December after episode with covid and progressive memory loss. Staff has no acute concerns today. Reports he is doing well.  No new behaviors or worsening anxiety.  Pt has no complaints   History reviewed. No pertinent past medical history. History reviewed. No pertinent surgical history.  No Known Allergies  Outpatient Encounter Medications as of 10/05/2023  Medication Sig   acetaminophen (TYLENOL) 500 MG tablet Take 1,000 mg by mouth 3 (three) times daily.   atorvastatin (LIPITOR) 20 MG tablet Take 20 mg by mouth daily.   Cholecalciferol (VITAMIN D3) 50 MCG (2000 UT) CAPS Take 1 capsule by mouth at bedtime.   diclofenac Sodium (VOLTAREN) 1 % GEL Apply 4 g topically 3 (three) times daily as needed.   DULoxetine (CYMBALTA) 60 MG capsule Take 2 capsules by mouth daily.   Emollient (EUCERIN) lotion Apply topically daily.   glucosamine-chondroitin 500-400 MG tablet Take 1 tablet by mouth daily.   ketoconazole (NIZORAL) 2 % shampoo Wash scalp 3-4 times weekly. Let sit for 5 minutes then wash out   Multiple Vitamins-Minerals (PRESERVISION AREDS 2) CHEW Chew 1 tablet by mouth 2 (two) times daily.   omeprazole (PRILOSEC) 20 MG capsule Take 20 mg by mouth  every other day.   polyethylene glycol (MIRALAX / GLYCOLAX) 17 g packet Take 17 g by mouth daily.   QUEtiapine (SEROQUEL) 25 MG tablet Take 75 mg by mouth at bedtime.   ergocalciferol (VITAMIN D2) 50000 units capsule Take by mouth. (Patient not taking: Reported on 10/05/2023)   [DISCONTINUED] DULoxetine (CYMBALTA) 60 MG capsule Take by mouth.   No facility-administered encounter medications on file as of 10/05/2023.    Review of Systems  Constitutional:  Negative for chills and fever.  HENT:  Negative for tinnitus.   Respiratory:  Negative for cough and shortness of breath.   Cardiovascular:  Negative for chest pain, palpitations and leg swelling.  Gastrointestinal:  Negative for abdominal pain, constipation and diarrhea.  Genitourinary:  Negative for dysuria, frequency and urgency.  Musculoskeletal:  Negative for back pain and myalgias.  Skin: Negative.   Neurological:  Negative for dizziness and headaches.  Psychiatric/Behavioral:  Positive for confusion.      Immunization History  Administered Date(s) Administered   Fluad Quad(high Dose 65+) 12/31/2018   Influenza, High Dose Seasonal PF 12/26/2014, 01/12/2017, 02/13/2018   Influenza-Unspecified 01/20/2014, 02/15/2014, 12/26/2014, 11/19/2015, 11/29/2015, 01/12/2017, 01/27/2021, 01/18/2022, 01/01/2023   Moderna Sars-Covid-2 Vaccination 05/05/2019   Pfizer Covid-19 Vaccine Bivalent Booster 82yrs & up 01/01/2023   Pneumococcal Conjugate-13 01/18/2014, 02/15/2014   Pneumococcal Polysaccharide-23 09/10/2009   Respiratory Syncytial Virus Vaccine,Recomb Aduvanted(Arexvy) 04/15/2022   Tdap 01/26/2013   Zoster Recombinant(Shingrix) 11/16/2017, 01/21/2018   Zoster, Live 02/18/2010   Pertinent  Health Maintenance Due  Topic Date Due   INFLUENZA VACCINE  11/19/2023       No data to display         Functional Status Survey:    Vitals:   10/05/23 1551  BP: 114/63  Pulse: 68  Resp: 17  Temp: 98 F (36.7 C)  SpO2: 97%   Weight: 169 lb 12.8 oz (77 kg)  Height: 5' 4 (1.626 m)   Body mass index is 29.15 kg/m. Physical Exam Constitutional:      General: He is not in acute distress.    Appearance: He is well-developed. He is not diaphoretic.  HENT:     Head: Normocephalic and atraumatic.     Right Ear: External ear normal.     Left Ear: External ear normal.     Mouth/Throat:     Pharynx: No oropharyngeal exudate.   Eyes:     Conjunctiva/sclera: Conjunctivae normal.     Pupils: Pupils are equal, round, and reactive to light.    Cardiovascular:     Rate and Rhythm: Normal rate and regular rhythm.     Heart sounds: Normal heart sounds.  Pulmonary:     Effort: Pulmonary effort is normal.     Breath sounds: Normal breath sounds.  Abdominal:     General: Bowel sounds are normal.     Palpations: Abdomen is soft.   Musculoskeletal:        General: No tenderness.     Cervical back: Normal range of motion and neck supple.     Right lower leg: No edema.     Left lower leg: No edema.   Skin:    General: Skin is warm and dry.   Neurological:     Mental Status: He is alert and oriented to person, place, and time. Mental status is at baseline.     Motor: Weakness present.     Gait: Gait abnormal.     Labs reviewed: Recent Labs    04/26/23 1242  NA 137  K 3.7  CL 97*  CO2 29  GLUCOSE 132*  BUN 16  CREATININE 0.67  CALCIUM 8.9   No results for input(s): AST, ALT, ALKPHOS, BILITOT, PROT, ALBUMIN in the last 8760 hours. Recent Labs    04/26/23 1242  WBC 9.4  HGB 13.5  HCT 40.7  MCV 98.5  PLT 268   No results found for: TSH No results found for: HGBA1C No results found for: CHOL, HDL, LDLCALC, LDLDIRECT, TRIG, CHOLHDL  Significant Diagnostic Results in last 30 days:  No results found.  Assessment/Plan HLD (hyperlipidemia) LDL 103 in October Continues on lipitor 20 mg daily Check lipids yearly  Benign non-nodular prostatic hyperplasia without  lower urinary tract symptoms Stable, not currently on medication at this time, will monitor  Essential hypertension Blood pressure well controlled, goal bp <140/90 Continue current medications and dietary modifications follow metabolic panel  Dementia with behavioral disturbance (HCC) Stable, no acute changes in cognitive or functional status, continue supportive care. Continues on Seroquel due to evening agitation which is controlled.   Macular degeneration Continues on preservision and supportive care from family and staff  Prediabetes Continue dietary modifications A1c was 6 in dec 2024   Slow transit constipation Controlled on miralax   Anxiety Stable on cymbalta- consider GDR      Erdem Naas K. Denney Fisherman [facility] [phone]

## 2023-10-05 NOTE — Assessment & Plan Note (Signed)
>>  ASSESSMENT AND PLAN FOR DEMENTIA WITH BEHAVIORAL DISTURBANCE (HCC) WRITTEN ON 10/05/2023  4:23 PM BY EUBANKS, JESSICA K, NP  Stable, no acute changes in cognitive or functional status, continue supportive care. Continues on Seroquel due to evening agitation which is controlled.

## 2023-10-05 NOTE — Assessment & Plan Note (Addendum)
 Stable, no acute changes in cognitive or functional status, continue supportive care. Continues on Seroquel due to evening agitation which is controlled.

## 2023-10-05 NOTE — Assessment & Plan Note (Signed)
 LDL 103 in October Continues on lipitor 20 mg daily Check lipids yearly

## 2023-10-05 NOTE — Assessment & Plan Note (Signed)
Controlled on miralax.

## 2023-10-05 NOTE — Assessment & Plan Note (Signed)
 Stable, not currently on medication at this time, will monitor

## 2023-10-05 NOTE — Assessment & Plan Note (Signed)
>>  ASSESSMENT AND PLAN FOR MACULAR DEGENERATION WRITTEN ON 10/05/2023  4:21 PM BY CARO, JESSICA K, NP  Continues on preservision and supportive care from family and staff

## 2023-10-05 NOTE — Assessment & Plan Note (Signed)
 Continue dietary modifications A1c was 6 in dec 2024

## 2023-10-25 LAB — BASIC METABOLIC PANEL WITH GFR
BUN: 23 — AB (ref 4–21)
CO2: 29 — AB (ref 13–22)
Chloride: 101 (ref 99–108)
Creatinine: 0.7 (ref 0.6–1.3)
Glucose: 82
Potassium: 3.6 meq/L (ref 3.5–5.1)
Sodium: 139 (ref 137–147)

## 2023-10-25 LAB — COMPREHENSIVE METABOLIC PANEL WITH GFR
Calcium: 8.4 — AB (ref 8.7–10.7)
eGFR: 83

## 2023-11-02 ENCOUNTER — Encounter: Payer: Self-pay | Admitting: Podiatry

## 2023-11-02 ENCOUNTER — Ambulatory Visit: Admitting: Podiatry

## 2023-11-02 VITALS — Ht 64.0 in | Wt 169.8 lb

## 2023-11-02 DIAGNOSIS — M79674 Pain in right toe(s): Secondary | ICD-10-CM

## 2023-11-02 DIAGNOSIS — M79675 Pain in left toe(s): Secondary | ICD-10-CM | POA: Diagnosis not present

## 2023-11-02 DIAGNOSIS — B351 Tinea unguium: Secondary | ICD-10-CM | POA: Diagnosis not present

## 2023-11-05 NOTE — Progress Notes (Signed)
   Chief Complaint  Patient presents with   Nail Problem    Pt is here for RFC.    SUBJECTIVE Patient presents to office today complaining of elongated, thickened nails that cause pain while ambulating in shoes.  Patient is unable to trim their own nails. Patient is here for further evaluation and treatment.  No past medical history on file.  No Known Allergies   OBJECTIVE General Patient is awake, alert, and oriented x 3 and in no acute distress. Derm Skin is dry and supple bilateral. Negative open lesions or macerations. Remaining integument unremarkable. Nails are tender, long, thickened and dystrophic with subungual debris, consistent with onychomycosis, 1-5 bilateral. No signs of infection noted. Vasc  DP and PT pedal pulses palpable bilaterally. Temperature gradient within normal limits.  Neuro Epicritic and protective threshold sensation grossly intact bilaterally.  Musculoskeletal Exam No symptomatic pedal deformities noted bilateral. Muscular strength within normal limits.  ASSESSMENT 1.  Pain due to onychomycosis of toenails both  PLAN OF CARE 1. Patient evaluated today.  2. Instructed to maintain good pedal hygiene and foot care.  3. Mechanical debridement of nails 1-5 bilaterally performed using a nail nipper. Filed with dremel without incident.  4. Return to clinic in 3 mos.    Thresa EMERSON Sar, DPM [facility]  Dr. Thresa EMERSON Sar, DPM    [address] [place], [place] [address]                Office [phone]  Fax [phone]

## 2023-11-18 ENCOUNTER — Encounter: Payer: Self-pay | Admitting: Nurse Practitioner

## 2023-11-18 ENCOUNTER — Non-Acute Institutional Stay: Admitting: Nurse Practitioner

## 2023-11-18 DIAGNOSIS — D0439 Carcinoma in situ of skin of other parts of face: Secondary | ICD-10-CM

## 2023-11-18 NOTE — Progress Notes (Signed)
 Location:  Other Nursing Home Room Number: [facility] 101-P Place of Service:  ALF [phone])  Levi Fine, MD  Patient Care Team: Levi Fine, MD as PCP - General (Family Medicine)  Extended Emergency Contact Information Primary Emergency Contact: Wilkinson,Levi Mobile Phone: [phone] Relation: Spouse Secondary Emergency Contact: Wilkinson,Levi Mobile Phone: [phone] Relation: Spouse  Goals of care: Advanced Directive information    11/18/2023   11:30 AM  Advanced Directives  Does Patient Have a Medical Advance Directive? Yes  Type of Advance Directive Healthcare Power of Attorney  Does patient want to make changes to medical advance directive? No - Patient declined  Copy of Healthcare Power of Attorney in Chart? Yes - validated most recent copy scanned in chart (See row information)     Chief Complaint  Patient presents with   Acute Visit    Spots on face    HPI:  Pt is a 89 y.o. male seen today for an acute visit for small red areas on face Pt with hx of carcinoma to face and being followed by dermatology He has been using [fluorouracil 5% topical cream] to treat area Nursing reports wife has noted other areas on face around eyes and nose more recently and wanted these areas evaluated.     History reviewed. No pertinent past medical history. History reviewed. No pertinent surgical history.  No Known Allergies  Outpatient Encounter Medications as of 11/18/2023  Medication Sig   acetaminophen (TYLENOL) 500 MG tablet Take 1,000 mg by mouth 3 (three) times daily.   atorvastatin (LIPITOR) 20 MG tablet Take 20 mg by mouth daily.   Cholecalciferol (VITAMIN D3) 50 MCG (2000 UT) CAPS Take 1 capsule by mouth at bedtime.   diclofenac Sodium (VOLTAREN) 1 % GEL Apply 4 g topically 3 (three) times daily as needed.   DULoxetine (CYMBALTA) 60 MG capsule Take 2 capsules by mouth daily.   Emollient (EUCERIN) lotion Apply topically daily.   glucosamine-chondroitin  500-400 MG tablet Take 1 tablet by mouth daily.   ketoconazole (NIZORAL) 2 % shampoo Wash scalp 3-4 times weekly. Let sit for 5 minutes then wash out   Multiple Vitamins-Minerals (PRESERVISION AREDS 2) CHEW Chew 1 tablet by mouth 2 (two) times daily.   omeprazole (PRILOSEC) 20 MG capsule Take 20 mg by mouth every other day.   polyethylene glycol (MIRALAX / GLYCOLAX) 17 g packet Take 17 g by mouth daily.   QUEtiapine (SEROQUEL) 25 MG tablet Take 75 mg by mouth at bedtime.   ergocalciferol (VITAMIN D2) 50000 units capsule Take by mouth. (Patient not taking: Reported on 11/18/2023)   No facility-administered encounter medications on file as of 11/18/2023.    Review of Systems  Unable to perform ROS: Dementia    Immunization History  Administered Date(s) Administered   Fluad Quad(high Dose 65+) 12/31/2018   Influenza, High Dose Seasonal PF 12/26/2014, 01/12/2017, 02/13/2018   Influenza-Unspecified 01/20/2014, 02/15/2014, 12/26/2014, 11/19/2015, 11/29/2015, 01/12/2017, 01/27/2021, 01/18/2022, 01/01/2023   Moderna Sars-Covid-2 Vaccination 05/05/2019   Pfizer Covid-19 Vaccine Bivalent Booster 35yrs & up 01/01/2023   Pneumococcal Conjugate-13 01/18/2014, 02/15/2014   Pneumococcal Polysaccharide-23 09/10/2009   Respiratory Syncytial Virus Vaccine,Recomb Aduvanted(Arexvy) 04/15/2022   Tdap 01/26/2013   Zoster Recombinant(Shingrix) 11/16/2017, 01/21/2018   Zoster, Live 02/18/2010   Pertinent  Health Maintenance Due  Topic Date Due   INFLUENZA VACCINE  11/19/2023       No data to display         Functional Status Survey:    Vitals:   11/18/23 1128  BP: 129/65  Pulse: 61  Resp: 16  Temp: 98.3 F (36.8 C)  SpO2: 97%  Weight: 182 lb 6.4 oz (82.7 kg)  Height: 5' 4 (1.626 m)   Body mass index is 31.31 kg/m. Physical Exam Skin:    Findings: Lesion (lesion noted to left side of face and temple, new small red areas under eye and bridge of nose. no drainage or pain noted) present.      Labs reviewed: Recent Labs    04/26/23 1242  NA 137  K 3.7  CL 97*  CO2 29  GLUCOSE 132*  BUN 16  CREATININE 0.67  CALCIUM 8.9   No results for input(s): AST, ALT, ALKPHOS, BILITOT, PROT, ALBUMIN in the last 8760 hours. Recent Labs    04/26/23 1242  WBC 9.4  HGB 13.5  HCT 40.7  MCV 98.5  PLT 268   No results found for: TSH No results found for: HGBA1C No results found for: CHOL, HDL, LDLCALC, LDLDIRECT, TRIG, CHOLHDL  Significant Diagnostic Results in last 30 days:  No results found.  Assessment/Plan 1. 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  No signs of infection at this time  Will have dermatology evaluate.    Levi Wilkinson [facility] [phone]

## 2024-01-17 ENCOUNTER — Encounter: Payer: Self-pay | Admitting: Adult Health

## 2024-01-17 ENCOUNTER — Non-Acute Institutional Stay: Payer: Self-pay | Admitting: Adult Health

## 2024-01-17 DIAGNOSIS — F339 Major depressive disorder, recurrent, unspecified: Secondary | ICD-10-CM

## 2024-01-17 DIAGNOSIS — M170 Bilateral primary osteoarthritis of knee: Secondary | ICD-10-CM | POA: Diagnosis not present

## 2024-01-17 DIAGNOSIS — F03918 Unspecified dementia, unspecified severity, with other behavioral disturbance: Secondary | ICD-10-CM

## 2024-01-17 NOTE — Progress Notes (Signed)
 Location:  Other ([facility]) Nursing Home Room Number: 101 P Place of Service:  ALF [phone]) Provider:  Medina-Vargas, Avaleen Brownley, DNP, FNP-BC  Patient Care Team: [name], MD as PCP - General (Family Medicine)  Extended Emergency Contact Information Primary Emergency Contact: Alphin,Martie Mobile Phone: [phone] Relation: Spouse Secondary Emergency Contact: Prada,martie Mobile Phone: [phone] Relation: Spouse  Code Status:   DNR  Goals of care: Advanced Directive information    11/18/2023   11:30 AM  Advanced Directives  Does Patient Have a Medical Advance Directive? Yes  Type of Advance Directive Healthcare Power of Attorney  Does patient want to make changes to medical advance directive? No - Patient declined  Copy of Healthcare Power of Attorney in Chart? Yes - validated most recent copy scanned in chart (See row information)     Chief Complaint  Patient presents with   Acute Visit    Right knee pain    HPI:  Pt is a 89 y.o. male seen today for an acute visit regarding knee pain. He is a resident of [facility]. Resident has been having pain on both knees and more so on the right. Wife came to visit and stated that resident has seen orthopedics, tried injections, and others. He is currently on Tylenol extra strength 500 mg 2 tablets 3 times a day and Voltaren external gel 1% apply 4 g topically to knees 3 times a day PRN. He was referred to PT for evaluation.  He has alzheimer's dementia with agitation for which he takes Seroquel 275 mg at bedtime. He is at a memory care unit.  He has depression for which he takes Cymbalta 120 mg at bedtime.   No past medical history on file. No past surgical history on file.  No Known Allergies  Outpatient Encounter Medications as of 01/17/2024  Medication Sig   acetaminophen (TYLENOL) 500 MG tablet Take 1,000 mg by mouth 3 (three) times daily.   atorvastatin (LIPITOR) 20 MG tablet  Take 20 mg by mouth daily.   Cholecalciferol (VITAMIN D3) 50 MCG (2000 UT) CAPS Take 1 capsule by mouth at bedtime.   diclofenac Sodium (VOLTAREN) 1 % GEL Apply 4 g topically 3 (three) times daily as needed.   DULoxetine (CYMBALTA) 60 MG capsule Take 2 capsules by mouth daily.   Emollient (EUCERIN) lotion Apply topically daily.   ergocalciferol (VITAMIN D2) 50000 units capsule Take by mouth. (Patient not taking: Reported on 11/18/2023)   glucosamine-chondroitin 500-400 MG tablet Take 1 tablet by mouth daily.   ketoconazole (NIZORAL) 2 % shampoo Wash scalp 3-4 times weekly. Let sit for 5 minutes then wash out   Multiple Vitamins-Minerals (PRESERVISION AREDS 2) CHEW Chew 1 tablet by mouth 2 (two) times daily.   omeprazole (PRILOSEC) 20 MG capsule Take 20 mg by mouth every other day.   polyethylene glycol (MIRALAX / GLYCOLAX) 17 g packet Take 17 g by mouth daily.   QUEtiapine (SEROQUEL) 25 MG tablet Take 75 mg by mouth at bedtime.   No facility-administered encounter medications on file as of 01/17/2024.    Review of Systems  Unable to obtain due to dementia.    Immunization History  Administered Date(s) Administered   Fluad Quad(high Dose 65+) 12/31/2018   INFLUENZA, HIGH DOSE SEASONAL PF 12/26/2014, 01/12/2017, 02/13/2018   Influenza-Unspecified 01/20/2014, 02/15/2014, 12/26/2014, 11/19/2015, 11/29/2015, 01/12/2017, 01/27/2021, 01/18/2022, 01/01/2023   Moderna Sars-Covid-2 Vaccination 05/05/2019   Pfizer Covid-19 Vaccine Bivalent Booster 71yrs & up 01/01/2023   Pneumococcal  Conjugate-13 01/18/2014, 02/15/2014   Pneumococcal Polysaccharide-23 09/10/2009   Respiratory Syncytial Virus Vaccine,Recomb Aduvanted(Arexvy) 04/15/2022   Tdap 01/26/2013   Zoster Recombinant(Shingrix) 11/16/2017, 01/21/2018   Zoster, Live 02/18/2010   Pertinent  Health Maintenance Due  Topic Date Due   Influenza Vaccine  11/19/2023       No data to display           Vitals:   01/17/24 1719  BP:  105/65  Pulse: 88  Resp: 16  Temp: 98.5 F (36.9 C)  Weight: 184 lb 9.6 oz (83.7 kg)  Height: 5' 4 (1.626 m)   Body mass index is 31.69 kg/m.  Physical Exam Constitutional:      Appearance: He is obese.  HENT:     Head: Normocephalic and atraumatic.     Mouth/Throat:     Mouth: Mucous membranes are moist.  Eyes:     Conjunctiva/sclera: Conjunctivae normal.  Cardiovascular:     Rate and Rhythm: Normal rate and regular rhythm.     Pulses: Normal pulses.     Heart sounds: Normal heart sounds.  Pulmonary:     Effort: Pulmonary effort is normal.     Breath sounds: Normal breath sounds.  Abdominal:     General: Bowel sounds are normal.     Palpations: Abdomen is soft.  Musculoskeletal:        General: No swelling.     Cervical back: Normal range of motion.  Skin:    General: Skin is warm and dry.  Neurological:     Mental Status: He is alert.  Psychiatric:        Mood and Affect: Mood normal.        Behavior: Behavior normal.      Labs reviewed: Recent Labs    04/26/23 1242  NA 137  K 3.7  CL 97*  CO2 29  GLUCOSE 132*  BUN 16  CREATININE 0.67  CALCIUM 8.9   No results for input(s): AST, ALT, ALKPHOS, BILITOT, PROT, ALBUMIN in the last 8760 hours. Recent Labs    04/26/23 1242  WBC 9.4  HGB 13.5  HCT 40.7  MCV 98.5  PLT 268   No results found for: TSH No results found for: HGBA1C No results found for: CHOL, HDL, LDLCALC, LDLDIRECT, TRIG, CHOLHDL  Significant Diagnostic Results in last 30 days:  No results found.  Assessment/Plan  1. Primary osteoarthritis of both knees (Primary) -  will start on Diclofenac gel 1% apply 4 gm to bilateral knees Q shift -  Apply knee brace to right knee when out of bed and off at bedtime -  discontinue PRN Diclofenac gel -  for PT evaluation and treatment  2. Major depression, recurrent, chronic -  mood is stable -  continue Cymbalta 120 mg daily  3. Dementia with behavioral  disturbance (HCC) -  no noted agitation -  continue Seroquel 75 mg at bedtime -  continue supportive care at memory care unit -  fall precautions   Family/ staff Communication: Discussed plan of care with wife and charge nurse.  Labs/tests ordered:  None    Hoy Fallert Medina-Vargas, DNP, MSN, FNP-BC [facility] and Adult Medicine [phone] (Monday-Friday 8:00 a.m. - 5:00 p.m.) [phone] (after hours)

## 2024-01-31 ENCOUNTER — Non-Acute Institutional Stay: Payer: Self-pay | Admitting: Adult Health

## 2024-01-31 ENCOUNTER — Encounter: Payer: Self-pay | Admitting: Adult Health

## 2024-01-31 DIAGNOSIS — K219 Gastro-esophageal reflux disease without esophagitis: Secondary | ICD-10-CM

## 2024-01-31 DIAGNOSIS — K5901 Slow transit constipation: Secondary | ICD-10-CM

## 2024-01-31 DIAGNOSIS — F339 Major depressive disorder, recurrent, unspecified: Secondary | ICD-10-CM | POA: Diagnosis not present

## 2024-01-31 DIAGNOSIS — M170 Bilateral primary osteoarthritis of knee: Secondary | ICD-10-CM | POA: Diagnosis not present

## 2024-01-31 DIAGNOSIS — G301 Alzheimer's disease with late onset: Secondary | ICD-10-CM

## 2024-01-31 DIAGNOSIS — F0280 Dementia in other diseases classified elsewhere without behavioral disturbance: Secondary | ICD-10-CM

## 2024-01-31 NOTE — Progress Notes (Signed)
 Location:  Other [facility]) Nursing Home Room Number: Levi Wilkinson 101-P [facility] ( Memory Care)) Place of Service:  ALF [phone]) Provider:  Medina-Vargas, Suheyla Mortellaro, DNP, FNP-BC  Patient Care Team: Levi Fine, MD as PCP - General (Family Medicine)  Extended Emergency Contact Information Primary Emergency Contact: Levi Wilkinson Mobile Phone: [phone] Relation: Spouse Secondary Emergency Contact: Levi Wilkinson Mobile Phone: [phone] Relation: Spouse  Code Status:  DNR  Goals of care: Advanced Directive information    01/31/2024    3:07 PM  Advanced Directives  Does Patient Have a Medical Advance Directive? Yes  Type of Advance Directive Out of facility DNR (pink MOST or yellow form)  Does patient want to make changes to medical advance directive? No - Patient declined     Chief Complaint  Patient presents with   Medical Management of Chronic Issues    Routine Visit, needs to discuss medicare annual wellness visit, tetanus, flu and covid vaccine.     HPI:  Pt is a 89 y.o. male seen today for medical management of chronic diseases. He is a resident of [facility]).  Primary osteoarthritis of both knees  -  has brace on right knee, takes Celebrex and Tylenol  Gastroesophageal reflux disease without esophagitis -takes omeprazole  Major depression, recurrent, chronic  Slow transit constipation -   takes MiraLAX   History reviewed. No pertinent past medical history. History reviewed. No pertinent surgical history.  No Known Allergies  Outpatient Encounter Medications as of 01/31/2024  Medication Sig   acetaminophen (TYLENOL) 500 MG tablet Take 1,000 mg by mouth 3 (three) times daily.   ammonium lactate (LAC-HYDRIN) 12 % lotion Apply 1 Application topically daily.   atorvastatin (LIPITOR) 20 MG tablet Take 20 mg by mouth daily.   celecoxib (CELEBREX) 100 MG capsule Take 100 mg by mouth daily.   Cholecalciferol (VITAMIN  D3) 50 MCG (2000 UT) CAPS Take 1 capsule by mouth at bedtime.   DULoxetine (CYMBALTA) 60 MG capsule Take 2 capsules by mouth daily.   Emollient (EUCERIN) lotion Apply topically daily.   glucosamine-chondroitin 500-400 MG tablet Take 1 tablet by mouth daily.   hydrocortisone 2.5 % cream Apply 1 Application topically 2 (two) times daily as needed.   ketoconazole (NIZORAL) 2 % shampoo Wash scalp 3-4 times weekly. Let sit for 5 minutes then wash out   Multiple Vitamins-Minerals (PRESERVISION AREDS 2) CHEW Chew 1 tablet by mouth 2 (two) times daily.   omeprazole (PRILOSEC) 20 MG capsule Take 20 mg by mouth every other day.   polyethylene glycol (MIRALAX / GLYCOLAX) 17 g packet Take 17 g by mouth daily.   QUEtiapine (SEROQUEL) 25 MG tablet Take 75 mg by mouth at bedtime.   diclofenac Sodium (VOLTAREN) 1 % GEL Apply 4 g topically 3 (three) times daily as needed. (Patient not taking: Reported on 01/31/2024)   ergocalciferol (VITAMIN D2) 50000 units capsule Take by mouth. (Patient not taking: Reported on 01/31/2024)   No facility-administered encounter medications on file as of 01/31/2024.    Review of Systems  Unable to obtain due to alzheimer's disease.  Immunization History  Administered Date(s) Administered   Fluad Quad(high Dose 65+) 12/31/2018   INFLUENZA, HIGH DOSE SEASONAL PF 12/26/2014, 01/12/2017, 02/13/2018   Influenza-Unspecified 01/20/2014, 02/15/2014, 12/26/2014, 11/19/2015, 11/29/2015, 01/12/2017, 01/27/2021, 01/18/2022, 01/01/2023   Moderna Sars-Covid-2 Vaccination 05/05/2019   Pfizer Covid-19 Vaccine Bivalent Booster 58yrs & up 01/01/2023   Pneumococcal Conjugate-13 01/18/2014, 02/15/2014   Pneumococcal Polysaccharide-23 09/10/2009   Respiratory Syncytial  Virus Vaccine,Recomb Aduvanted(Arexvy) 04/15/2022   Tdap 01/26/2013   Zoster Recombinant(Shingrix) 11/16/2017, 01/21/2018   Zoster, Live 02/18/2010   Pertinent  Health Maintenance Due  Topic Date Due   Influenza Vaccine   11/19/2023       No data to display           Vitals:   01/31/24 1451  BP: 125/83  Pulse: 83  Resp: 16  Temp: 98.2 F (36.8 C)  SpO2: 97%  Weight: 185 lb 6.4 oz (84.1 kg)  Height: 5' 4 (1.626 m)   Body mass index is 31.82 kg/m.  Physical Exam Constitutional:      General: He is not in acute distress.    Appearance: He is obese.  HENT:     Head: Normocephalic and atraumatic.     Mouth/Throat:     Mouth: Mucous membranes are moist.  Eyes:     Conjunctiva/sclera: Conjunctivae normal.  Cardiovascular:     Rate and Rhythm: Normal rate and regular rhythm.     Pulses: Normal pulses.     Heart sounds: Normal heart sounds.  Pulmonary:     Effort: Pulmonary effort is normal.     Breath sounds: Normal breath sounds.  Abdominal:     General: Bowel sounds are normal.     Palpations: Abdomen is soft.  Musculoskeletal:        General: No swelling.     Cervical back: Normal range of motion.     Comments: Uses walker when ambulating.  Skin:    General: Skin is warm and dry.  Psychiatric:        Mood and Affect: Mood normal.        Behavior: Behavior normal.     Labs reviewed: Recent Labs    04/26/23 1242  NA 137  K 3.7  CL 97*  CO2 29  GLUCOSE 132*  BUN 16  CREATININE 0.67  CALCIUM 8.9   No results for input(s): AST, ALT, ALKPHOS, BILITOT, PROT, ALBUMIN in the last 8760 hours. Recent Labs    04/26/23 1242  WBC 9.4  HGB 13.5  HCT 40.7  MCV 98.5  PLT 268   No results found for: TSH No results found for: HGBA1C No results found for: CHOL, HDL, LDLCALC, LDLDIRECT, TRIG, CHOLHDL  Significant Diagnostic Results in last 30 days:  No results found.  Assessment/Plan  1. Primary osteoarthritis of both knees (Primary) -   Continue Celebrex 100 mg daily -   Continue right knee brace -   Continue Tylenol 500 mg 2 tabs = 1000 mg 3 times a day  2. Gastroesophageal reflux disease without esophagitis -   Denies heartburns -    Continue omeprazole 20 mg every other day  3. Major depression, recurrent, chronic -   Mood is stable -    Continue Cymbalta 60 mg 2 capsules at bedtime -    Continue Seroquel 25 mg 3 times a day  4. Slow transit constipation -   Continue MiraLAX 17 g daily  5. Late onset Alzheimer's disease without behavioral disturbance (HCC) -   Continue supportive care    Family/ staff Communication: Discussed plan of care with charge nurse.  Labs/tests ordered: None    Kyros Salzwedel Medina-Vargas, DNP, MSN, FNP-BC [facility] and Adult Medicine [phone] (Monday-Friday 8:00 a.m. - 5:00 p.m.) [phone] (after hours)

## 2024-02-21 LAB — CBC AND DIFFERENTIAL
HCT: 41 (ref 41–53)
Hemoglobin: 13.5 (ref 13.5–17.5)
Neutrophils Absolute: 2657
Platelets: 207 K/uL (ref 150–400)
WBC: 5.2

## 2024-02-21 LAB — HEPATIC FUNCTION PANEL
ALT: 24 U/L (ref 10–40)
AST: 17 (ref 14–40)
Alkaline Phosphatase: 68 (ref 25–125)
Bilirubin, Total: 0.6

## 2024-02-21 LAB — COMPREHENSIVE METABOLIC PANEL WITH GFR
Albumin: 3.8 (ref 3.5–5.0)
Calcium: 9.4 (ref 8.7–10.7)
Globulin: 2.4
eGFR: 81

## 2024-02-21 LAB — CBC: RBC: 4.21 (ref 3.87–5.11)

## 2024-02-21 LAB — BASIC METABOLIC PANEL WITH GFR
BUN: 21 (ref 4–21)
CO2: 32 — AB (ref 13–22)
Chloride: 102 (ref 99–108)
Creatinine: 0.8 (ref 0.6–1.3)
Glucose: 105
Potassium: 4.6 meq/L (ref 3.5–5.1)
Sodium: 138 (ref 137–147)

## 2024-02-29 ENCOUNTER — Ambulatory Visit: Admitting: Podiatry

## 2024-03-07 NOTE — Progress Notes (Signed)
 Pharmacy Quality Measure Review  This patient is appearing on a report for being at risk of failing the adherence measure for cholesterol (statin) medications this calendar year.   Medication: atorvastatin 20 mg Last fill date: 01/12/24 for 30 day supply  The patient receives medications from [facility], which dispenses in 7-day cycle packs and then monthly composite billing. The patient has been having atorvastatin filled monthly.  Woodie Jock, PharmD PGY1 Pharmacy Resident  03/07/2024

## 2024-03-09 ENCOUNTER — Non-Acute Institutional Stay: Payer: Self-pay | Admitting: Internal Medicine

## 2024-03-09 ENCOUNTER — Encounter: Payer: Self-pay | Admitting: Internal Medicine

## 2024-03-09 DIAGNOSIS — G301 Alzheimer's disease with late onset: Secondary | ICD-10-CM

## 2024-03-09 DIAGNOSIS — E782 Mixed hyperlipidemia: Secondary | ICD-10-CM | POA: Diagnosis not present

## 2024-03-09 DIAGNOSIS — M1711 Unilateral primary osteoarthritis, right knee: Secondary | ICD-10-CM

## 2024-03-09 DIAGNOSIS — I10 Essential (primary) hypertension: Secondary | ICD-10-CM

## 2024-03-09 DIAGNOSIS — Z0000 Encounter for general adult medical examination without abnormal findings: Secondary | ICD-10-CM

## 2024-03-09 DIAGNOSIS — K219 Gastro-esophageal reflux disease without esophagitis: Secondary | ICD-10-CM

## 2024-03-09 DIAGNOSIS — M8589 Other specified disorders of bone density and structure, multiple sites: Secondary | ICD-10-CM

## 2024-03-09 DIAGNOSIS — K5901 Slow transit constipation: Secondary | ICD-10-CM

## 2024-03-09 DIAGNOSIS — N400 Enlarged prostate without lower urinary tract symptoms: Secondary | ICD-10-CM

## 2024-03-09 DIAGNOSIS — F419 Anxiety disorder, unspecified: Secondary | ICD-10-CM | POA: Diagnosis not present

## 2024-03-09 DIAGNOSIS — H353190 Nonexudative age-related macular degeneration, unspecified eye, stage unspecified: Secondary | ICD-10-CM

## 2024-03-09 DIAGNOSIS — F02B4 Dementia in other diseases classified elsewhere, moderate, with anxiety: Secondary | ICD-10-CM

## 2024-03-09 DIAGNOSIS — M792 Neuralgia and neuritis, unspecified: Secondary | ICD-10-CM | POA: Diagnosis not present

## 2024-03-09 DIAGNOSIS — F03918 Unspecified dementia, unspecified severity, with other behavioral disturbance: Secondary | ICD-10-CM

## 2024-03-09 NOTE — Assessment & Plan Note (Addendum)
 Blood pressures currently well-controlled.  He is not taking any antihypertensive medications.

## 2024-03-09 NOTE — Progress Notes (Signed)
 [facility] Routine Visit Progress Note    Location:  Other [place].  Nursing Home Room Number: [facility] [id number] Place of Service:  ALF (13)   PCP: Laurence Locus, DO   Patient Care Team: Laurence Locus, DO as PCP - General (Internal Medicine)   Extended Emergency Contact Information Primary Emergency Contact: Forstner,Martie Mobile Phone: [phone] Relation: Spouse Secondary Emergency Contact: Holzworth,martie Mobile Phone: [phone] Relation: Spouse   Goals of care: Advanced Directive information    01/31/2024    3:07 PM  Advanced Directives  Does Patient Have a Medical Advance Directive? Yes  Type of Advance Directive Out of facility DNR (pink MOST or yellow form)  Does patient want to make changes to medical advance directive? No - Patient declined    CODE STATUS: Do Not Resuscitate (DNR)   Chief Complaint  Patient presents with   Medical Management of Chronic Issues    Medical Management of Chronic Issues.      HPI: Pt is a 89 y.o. male seen today for medical management of chronic disease.   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  His wife requested a meeting with the medical director this morning.  I spent an hour and 20 minutes talking with the patient's wife Milderd Haver.  She request that she be called after he has had a routine visit just to keep her up-to-date with the patient's health.  Patient moved into memory care in January 2025 after both he and his wife had contracted COVID.  She states that she and her husband had never had COVID even during the COVID pandemic.  When they both contracted COVID, she realized that she would not be able to care for him due to her acute illness and she was forced to transfer the patient to memory care initially for respite.   After discussion with the staff, it was felt that the patient should stay in memory care due to the wife's inability to continue to care for him at home.  She has struggled with this decision.  She visits the patient frequently.  She states that she has not left [facility] facility overnight and at least 6 or 7 years due to her feeling obligated to continue to visit him every day.  Patient's health is declining.  She is cognizant of that.  His dementia is getting worse.  She states that she has enlisted the help of an attorney, forensic scientist and recently hired a futures trader to help with the patient's aging process.  She has never really thought about specifically at what point she would want comfort care measures to be instituted.  The patient is DNR.  Patient is about 15 years older than her.  They have been married over 30 years now.  She has no children with the patient.  This is the patient's second marriage.  He has 2 daughters that are grown.  Both his daughters do not participate in the patient's care.  This makes the wife quite upset.  She states that she is going to therapy to discuss her feelings about this.  Knee pain and gait instability - Significant knee pain, particularly in the right knee - Right knee gives out and produces crackling noises - History of falls and lifelong fall risk - Leg length discrepancy accommodated with custom-made shoes -  Knee previously drained once - Received hyaluronic acid and cortisone injections in the past, with recent lack of efficacy - No history of knee replacement - Mobility significantly affected by knee pain  Chronic back pain and vertebral fracture - History of back pain secondary to L3 vertebral fracture - Underwent kyphoplasty for L3 fracture - Received various treatments at a pain clinic - Back pain, in combination with knee pain, significantly impairs mobility  Cognitive impairment and communication difficulties - Dementia with  limited communication - Difficulty engaging in previously enjoyed activities such as reading and watching baseball  Visual impairment - Macular degeneration affecting vision  Medication tolerability - On Seroquel for agitation - On duloxetine for pain management - Difficulty swallowing capsules  Weight gain and dietary concerns - Gained 20 pounds since moving to the facility - Diet adjusted to include more vegetables and less sugar - Ongoing concern regarding carbohydrate and sugar intake, especially in relation to knee and back issues  Gastroesophageal reflux - History of silent reflux - On pantoprazole - No history of heart issues or stomach ulcers  Anxiety related to facility environment - Anxiety triggered by the behavior of a fellow resident, affecting comfort in the facility  Past Medical History:  Diagnosis Date   Anxiety    Chronic low back pain    Closed compression fracture of L3 vertebra (HCC)    Diverticulosis of large intestine without hemorrhage 03/05/2014   Overview:   Noted on colonoscopy 2010     Dry age-related macular degeneration 03/26/2017   GERD (gastroesophageal reflux disease)    H/O: rheumatic fever 03/26/2017   Moderate Alzheimer's dementia with anxiety (HCC) 02/12/2023   Neuropathic pain    Primary osteoarthritis of right knee    Past Surgical History:  Procedure Laterality Date   KYPHOPLASTY       No Known Allergies   Outpatient Encounter Medications as of 03/09/2024  Medication Sig   acetaminophen (TYLENOL) 500 MG tablet Take 1,000 mg by mouth 3 (three) times daily.   ammonium lactate (LAC-HYDRIN) 12 % lotion Apply 1 Application topically daily.   atorvastatin (LIPITOR) 20 MG tablet Take 20 mg by mouth daily.   carboxymethylcellulose (REFRESH PLUS) 0.5 % SOLN Place 1 drop into both eyes 3 (three) times daily as needed.   celecoxib (CELEBREX) 100 MG capsule Take 100 mg by mouth daily.   Cholecalciferol (VITAMIN D3) 50 MCG (2000 UT) CAPS  Take 1 capsule by mouth at bedtime.   DULoxetine (CYMBALTA) 30 MG capsule Take 30 mg by mouth at bedtime.   Emollient (EUCERIN) lotion Apply topically daily.   glucosamine-chondroitin 500-400 MG tablet Take 1 tablet by mouth daily.   hydrocortisone 2.5 % cream Apply 1 Application topically 2 (two) times daily as needed.   ketoconazole (NIZORAL) 2 % shampoo Wash scalp 3-4 times weekly. Let sit for 5 minutes then wash out   Multiple Vitamins-Minerals (PRESERVISION AREDS 2) CHEW Chew 1 tablet by mouth 2 (two) times daily.   omeprazole (PRILOSEC) 20 MG capsule Take 20 mg by mouth every other day.   polyethylene glycol (MIRALAX / GLYCOLAX) 17 g packet Take 17 g by mouth daily.   QUEtiapine (SEROQUEL) 25 MG tablet Take 75 mg by mouth at bedtime.   diclofenac Sodium (VOLTAREN) 1 % GEL Apply 4 g topically 3 (three) times daily as needed. (Patient not taking: Reported on 03/09/2024)   ergocalciferol (VITAMIN D2) 50000 units capsule Take by mouth. (Patient not taking: Reported on 03/09/2024)   No facility-administered encounter  medications on file as of 03/09/2024.   Social History   Socioeconomic History   Marital status: Married    Spouse name: Forero,Martie   Number of children: 2   Years of education: Not on file   Highest education level: Not on file  Occupational History   Occupation: Retired Radiographer, therapeutic  Tobacco Use   Smoking status: Never   Smokeless tobacco: Never  Substance and Sexual Activity   Alcohol use: Not Currently    Comment: a beer occasionally   Drug use: No   Sexual activity: Not on file  Other Topics Concern   Not on file  Social History Narrative    patient is married to Hall.  This is the patient's second marriage.  Patient and his second wife do not have any children together.  Patient has 2 daughters from his first marriage.  His 2 daughters are not involved in his care.      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 He and his current wife moved from [place]  to [place] , living in [place] before moving to [place]. Octaviano has a history of cognitive decline and mobility issues, including a fractured L3 vertebrae and knee problems. He has macular degeneration and is experiencing memory issues. His wife is his primary caregiver and is involved in his care decisions. Bob's daughters are not very engaged in his care. His wife is in therapy and has a support system in place for Bob's care. Octaviano is a baseball fan and was an avid reader.   Social Drivers of Health   Financial Resource Strain: Patient Declined (02/08/2023)   Received from [facility]   Overall Financial Resource Strain (CARDIA)    Difficulty of Paying Living Expenses: Patient declined  Food Insecurity: Patient Declined (02/08/2023)   Received from [facility]   Hunger Vital Sign    Within the past 12 months, the food you bought just didn't last and you didn't have money to get more.: Patient declined    Within the past 12 months, you worried that your food would run out before you got the money to buy more.: Patient declined  Transportation Needs: Patient Declined (02/08/2023)   Received from [facility]   PRAPARE - Transportation    Lack of Transportation (Non-Medical): Patient declined    In the past 12 months, has lack of transportation kept you from medical appointments or from getting medications?: Patient declined  Physical Activity: Not on file  Stress: Not on file  Social Connections: Not on file   patient is married to [facility].  This is the patient's second marriage.  Patient and his second wife do not have any children together.  Patient has 2 daughters from his first marriage.  His 2 daughters are not involved in his care.    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 He and his current wife moved from [place]  to  , living in [place] before moving to [facility]. Octaviano has a history of cognitive decline and mobility issues, including a fractured L3 vertebrae and knee problems. He has macular degeneration and  is experiencing memory issues. His wife is his primary caregiver and is involved in his care decisions. Bob's daughters are not very engaged in his care. His wife is in therapy and has a support system in place for Bob's care. Octaviano is a baseball fan and was an avid reader.    Review of Systems  Unable to perform ROS: Dementia      Immunization History  Administered Date(s) Administered   Fluad Quad(high Dose 65+) 12/31/2018   INFLUENZA, HIGH DOSE SEASONAL PF 12/26/2014, 01/12/2017, 02/13/2018   Influenza-Unspecified 01/20/2014, 02/15/2014, 12/26/2014, 11/19/2015, 11/29/2015, 01/12/2017, 01/27/2021, 01/18/2022, 01/01/2023, 02/01/2024   Moderna Sars-Covid-2 Vaccination 05/05/2019   Pfizer Covid-19 Vaccine Bivalent Booster 93yrs & up 01/01/2023   Pneumococcal Conjugate-13 01/18/2014, 02/15/2014   Pneumococcal Polysaccharide-23 09/10/2009   Respiratory Syncytial Virus Vaccine,Recomb Aduvanted(Arexvy) 04/15/2022   Tdap 01/26/2013   Zoster Recombinant(Shingrix) 11/16/2017, 01/21/2018   Zoster, Live 02/18/2010   Pertinent  Health Maintenance Due  Topic Date Due   Influenza Vaccine  Completed       No data to display         Functional Status Survey:     Vitals:   03/09/24 0904  BP: 123/68  Pulse: 98  Resp: 16  Temp: 97.8 F (36.6 C)  SpO2: 97%  Weight: 184 lb 9.6 oz (83.7 kg)  Height: 5' 4 (1.626 m)   Body mass index is 31.69 kg/m. Physical Exam Vitals reviewed.  Constitutional:      General: He is not in acute distress.    Appearance: He is  not toxic-appearing or diaphoretic.  HENT:     Head: Normocephalic and atraumatic.     Nose: Nose normal.  Cardiovascular:     Rate and Rhythm: Normal rate and regular rhythm.  Pulmonary:     Effort: Pulmonary effort is normal. No respiratory distress.     Breath sounds: Normal breath sounds. No wheezing.  Abdominal:     General: Bowel sounds are normal.     Palpations: Abdomen is soft.  Musculoskeletal:     Right knee: Deformity present. No effusion. No tenderness.     Right lower leg: No edema.     Left lower leg: No edema.     Comments: Valgus deformity of Right knee  Skin:    General: Skin is warm and dry.     Capillary Refill: Capillary refill takes less than 2 seconds.  Neurological:     General: No focal deficit present.     Mental Status: He is alert. He is disoriented.      Labs reviewed: Recent Labs    04/26/23 1242 10/25/23 0000 02/21/24 0000  NA 137 139 138  K 3.7 3.6 4.6  CL 97* 101 102  CO2 29 29* 32*  GLUCOSE 132*  --   --   BUN 16 23* 21  CREATININE 0.67 0.7 0.8  CALCIUM 8.9 8.4* 9.4   Recent Labs    02/21/24 0000  AST 17  ALT 24  ALKPHOS 68  ALBUMIN 3.8   Recent Labs    04/26/23 1242 02/21/24 0000  WBC 9.4 5.2  NEUTROABS  --  2,657.00  HGB 13.5 13.5  HCT 40.7 41  MCV 98.5  --   PLT 268 207    Assessment & Plan Healthcare maintenance I had a very candid conversation with the wife Milderd about the patient's declining health and what to expect as he starts to get even older.  Given that there is a 15-year age gap  between them, the patient will likely dying much sooner than she.  I gave her permission to continue to enjoy her time in life here at [facility].  She states that she has not left the campus here for at least 6 or 7 years because she is worried about her husband at nighttime.  She is going to therapy to work out her feelings.  I told her that the patient has moderate to severe dementia.  The patient unlikely remembers whether the  wife is its him on a daily basis.  I think she needs permission to start letting go and being so involved in his healthcare as his disease process will continue whether or not she is present on a day-to-day basis.  I think she had some relief in hearing that.  I asked her to consider at what point she would want the patient to be kept comfortable and to have hospice-like care initiated.  I discussed with the wife that myself and my colleagues are very comfortable providing hospice level care without having to consult a formal hospice agency.  If the goal is to keep the patient comfortable, we can certainly provide that in memory care.  I asked the wife to start talking to her therapist, they are friends and other trusted individuals about when would be the right time to initiate comfort care.  She states that she would also like to hear from me when I thought that the time had come for Octaviano to transition to comfort care measures.  I assured her that I am I partners would keep her abreast of changes to the patient's health as we will be seeing him on a monthly basis.     Moderate late onset Alzheimer's dementia with anxiety (HCC) Progressive cognitive decline with significant memory impairment and reduced engagement in activities. Concerns about quality of life and future care planning. Discussion about the line between maintaining health and focusing on comfort care. Emphasis on forward planning and understanding the point at which comfort care should be prioritized over health maintenance. - Continue routine monthly visits to monitor cognitive decline and overall health. - Discuss with family about the line between maintaining health and focusing on comfort care. - Encourage forward planning and understanding of future care needs. - Continue with Seroquel 75 mg at bedtime to control evening agitation. -Discussed eliminating unnecessary medications to prevent pill fatigue.  Patient's wife Milderd is in  agreement.  Discontinue PreserVision, vitamin D, Lipitor.,  Glucosamine     Neuropathic pain Patient can continue duloxetine 30 mg nightly.  If he is unable to swallow capsules anymore, the capsules can be open.     Primary osteoarthritis of right knee Wife is interested in pursuing another knee injection to see if this helps at all with his right knee pain.  Will order triamcinolone 40 mg/mL and some Marcaine 0.5 mg vial.  Will try to inject his knee next week.  Continue with scheduled Tylenol 1000 mg 3 times daily.  Will stop his glucosamine     Gastroesophageal reflux disease without esophagitis Was taking Prilosec 20 mg every other day.  Will change to Prilosec 20 mg every day since we are going to increase his Celebrex to 100 mg twice daily for his arthritis.     Nonexudative age-related macular degeneration, unspecified laterality, unspecified stage Wife states the patient has poor vision now.  He has lost ability to read which is one of his prior passions.  Patient is not going  to get any injections in his eye.  Will go ahead and stop her his PreserVision multivitamin.  He is eating a well-balanced diet and memory care.     Essential hypertension Blood pressures currently well-controlled.  He is not taking any antihypertensive medications.     Mixed hyperlipidemia Discussed at length with his wife Milderd.  Will try to eliminate any unnecessary medications.  Discussed that at age 51, I am not concerned about any cardiovascular disease.  Will stop his Lipitor.  He does not need yearly lipid checks anymore.     Slow transit constipation Continue with MiraLAX 1 scoop daily to prevent constipation.     Osteopenia of multiple sites Given his advanced age of 76, will stop with vitamin D supplementation.  He does eat lots of ice cream.  He is getting enough calcium in his diet.     Anxiety Continue him on Seroquel 75 mg at nighttime due to nighttime agitation and anxiety.   Wife states that there is 1 particular resident who gives him quite a lot of anxiety.  He tries to avoid this resident because her behavior makes him anxious.     Benign non-nodular prostatic hyperplasia without lower urinary tract symptoms Stable.  Patient is wearing adult briefs.      Camellia Door, DO  [facility] [phone]

## 2024-03-09 NOTE — Assessment & Plan Note (Addendum)
 Discussed at length with his wife Milderd.  Will try to eliminate any unnecessary medications.  Discussed that at age 89, I am not concerned about any cardiovascular disease.  Will stop his Lipitor.  He does not need yearly lipid checks anymore.

## 2024-03-09 NOTE — Assessment & Plan Note (Addendum)
 Wife states the patient has poor vision now.  He has lost ability to read which is one of his prior passions.  Patient is not going to get any injections in his eye.  Will go ahead and stop her his PreserVision multivitamin.  He is eating a well-balanced diet and memory care.

## 2024-03-09 NOTE — Assessment & Plan Note (Addendum)
 Continue him on Seroquel 75 mg at nighttime due to nighttime agitation and anxiety.  Wife states that there is 1 particular resident who gives him quite a lot of anxiety.  He tries to avoid this resident because her behavior makes him anxious.

## 2024-03-09 NOTE — Assessment & Plan Note (Addendum)
 Patient can continue duloxetine 30 mg nightly.  If he is unable to swallow capsules anymore, the capsules can be open.

## 2024-03-09 NOTE — Assessment & Plan Note (Addendum)
 Continue with MiraLAX 1 scoop daily to prevent constipation.

## 2024-03-09 NOTE — Assessment & Plan Note (Addendum)
 Given his advanced age of 42, will stop with vitamin D supplementation.  He does eat lots of ice cream.  He is getting enough calcium in his diet.

## 2024-03-09 NOTE — Assessment & Plan Note (Addendum)
 Progressive cognitive decline with significant memory impairment and reduced engagement in activities. Concerns about quality of life and future care planning. Discussion about the line between maintaining health and focusing on comfort care. Emphasis on forward planning and understanding the point at which comfort care should be prioritized over health maintenance. - Continue routine monthly visits to monitor cognitive decline and overall health. - Discuss with family about the line between maintaining health and focusing on comfort care. - Encourage forward planning and understanding of future care needs. - Continue with Seroquel 75 mg at bedtime to control evening agitation. -Discussed eliminating unnecessary medications to prevent pill fatigue.  Patient's wife Milderd is in agreement.  Discontinue PreserVision, vitamin D, Lipitor.,  Glucosamine

## 2024-03-09 NOTE — Assessment & Plan Note (Addendum)
 Stable.  Patient is wearing adult briefs.

## 2024-03-09 NOTE — Assessment & Plan Note (Addendum)
 Wife is interested in pursuing another knee injection to see if this helps at all with his right knee pain.  Will order triamcinolone 40 mg/mL and some Marcaine 0.5 mg vial.  Will try to inject his knee next week.  Continue with scheduled Tylenol 1000 mg 3 times daily.  Will stop his glucosamine

## 2024-03-09 NOTE — Assessment & Plan Note (Addendum)
 I had a very candid conversation with the wife Levi Wilkinson about the patient's declining health and what to expect as he starts to get even older.  Given that there is a 15-year age gap between them, the patient will likely dying much sooner than she.  I gave her permission to continue to enjoy her time in life here at [facility].  She states that she has not left the campus here for at least 6 or 7 years because she is worried about her husband at nighttime.  She is going to therapy to work out her feelings.  I told her that the patient has moderate to severe dementia.  The patient unlikely remembers whether the wife is its him on a daily basis.  I think she needs permission to start letting go and being so involved in his healthcare as his disease process will continue whether or not she is present on a day-to-day basis.  I think she had some relief in hearing that.  I asked her to consider at what point she would want the patient to be kept comfortable and to have hospice-like care initiated.  I discussed with the wife that myself and my colleagues are very comfortable providing hospice level care without having to consult a formal hospice agency.  If the goal is to keep the patient comfortable, we can certainly provide that in memory care.  I asked the wife to start talking to her therapist, they are friends and other trusted individuals about when would be the right time to initiate comfort care.  She states that she would also like to hear from me when I thought that the time had come for Levi Wilkinson to transition to comfort care measures.  I assured her that I am I partners would keep her abreast of changes to the patient's health as we will be seeing him on a monthly basis.

## 2024-03-09 NOTE — Assessment & Plan Note (Addendum)
 Was taking Prilosec 20 mg every other day.  Will change to Prilosec 20 mg every day since we are going to increase his Celebrex to 100 mg twice daily for his arthritis.

## 2024-03-10 ENCOUNTER — Non-Acute Institutional Stay (SKILLED_NURSING_FACILITY): Payer: Self-pay | Admitting: Internal Medicine

## 2024-03-10 ENCOUNTER — Encounter: Payer: Self-pay | Admitting: Internal Medicine

## 2024-03-10 DIAGNOSIS — M1711 Unilateral primary osteoarthritis, right knee: Secondary | ICD-10-CM | POA: Diagnosis not present

## 2024-03-10 NOTE — Assessment & Plan Note (Addendum)
 Pt had 40 mg of Kenalog and a total of 4 ml of marcaine 0.5% injection into right knee joint. Pt able to walk at least 50-75% faster than he did before the injection. Pt's wife was very pleased that pt was able to walk so well.  Will re-visit how his knee is doing next week to see what effect the steroids have on his OA of right knee.  Orders:   Joint Injection/Arthrocentesis

## 2024-03-10 NOTE — Progress Notes (Signed)
 [facility] Acute Care Progress Note    Location:  Other Nursing Home Room Number: [facility] [place] rm [address] Place of Service:  SNF (31)   Laurence Locus, DO   Patient Care Team: Laurence Locus, DO as PCP - General (Internal Medicine)   Extended Emergency Contact Information Primary Emergency Contact: Vandeusen,Martie Mobile Phone: [phone] Relation: Spouse Secondary Emergency Contact: Littman,martie Mobile Phone: [phone] Relation: Spouse   Goals of care: Advanced Directive information    01/31/2024    3:07 PM  Advanced Directives  Does Patient Have a Medical Advance Directive? Yes  Type of Advance Directive Out of facility DNR (pink MOST or yellow form)  Does patient want to make changes to medical advance directive? No - Patient declined     CODE STATUS: Do Not Resuscitate (DNR)   Chief Complaint  Patient presents with   right knee OA    Needs right knee steroid injection     HPI: Pt is a 89 y.o. male seen today for an acute visit for right knee arthritis. Had discussed steroid injection into right knee with wife yesterday. Was able to arrange for Kenalog and Marcaine vials to be brought to Memory care for injection today. I called wife and she wanted to be present for his injection.     Past Medical History:  Diagnosis Date   Anxiety    Chronic low back pain    Closed compression fracture of L3 vertebra (HCC)    Diverticulosis of large intestine without hemorrhage 03/05/2014   Overview:   Noted on colonoscopy 2010     Dry age-related macular degeneration 03/26/2017   GERD (gastroesophageal reflux disease)    H/O: rheumatic fever 03/26/2017   Moderate Alzheimer's dementia with anxiety (HCC) 02/12/2023   Neuropathic pain    Primary osteoarthritis of right knee    Past Surgical History:  Procedure Laterality Date   KYPHOPLASTY       No Known Allergies   Outpatient Encounter Medications as of 03/10/2024  Medication Sig   acetaminophen  (TYLENOL) 500 MG tablet Take 1,000 mg by mouth 3 (three) times daily.   ammonium lactate (LAC-HYDRIN) 12 % lotion Apply 1 Application topically daily.   atorvastatin (LIPITOR) 20 MG tablet Take 20 mg by mouth daily.   carboxymethylcellulose (REFRESH PLUS) 0.5 % SOLN Place 1 drop into both eyes 3 (three) times daily as needed.   celecoxib (CELEBREX) 100 MG capsule Take 100 mg by mouth daily.   Cholecalciferol (VITAMIN D3) 50 MCG (2000 UT) CAPS Take 1 capsule by mouth at bedtime.   diclofenac Sodium (VOLTAREN) 1 % GEL Apply 4 g topically 3 (three) times daily as needed. (Patient not taking: Reported on 03/09/2024)   DULoxetine (CYMBALTA) 30 MG capsule Take 30 mg by mouth at bedtime.   Emollient (EUCERIN) lotion Apply topically daily.   ergocalciferol (VITAMIN D2) 50000 units capsule Take by mouth. (Patient not taking: Reported on 03/09/2024)   glucosamine-chondroitin 500-400 MG tablet Take 1 tablet by mouth daily.   hydrocortisone 2.5 % cream Apply 1 Application topically 2 (two) times daily as needed.   ketoconazole (NIZORAL) 2 % shampoo Wash scalp 3-4 times weekly. Let sit for 5 minutes then wash out   Multiple Vitamins-Minerals (PRESERVISION AREDS 2) CHEW Chew 1 tablet by mouth 2 (two) times daily.   omeprazole (PRILOSEC) 20 MG capsule Take 20 mg by mouth every other day.   polyethylene glycol (MIRALAX / GLYCOLAX) 17 g packet Take 17 g by mouth  daily.   QUEtiapine (SEROQUEL) 25 MG tablet Take 75 mg by mouth at bedtime.   No facility-administered encounter medications on file as of 03/10/2024.     Review of Systems  Unable to perform ROS: Dementia     Immunization History  Administered Date(s) Administered   Fluad Quad(high Dose 65+) 12/31/2018   INFLUENZA, HIGH DOSE SEASONAL PF 12/26/2014, 01/12/2017, 02/13/2018   Influenza-Unspecified 01/20/2014, 02/15/2014, 12/26/2014, 11/19/2015, 11/29/2015, 01/12/2017, 01/27/2021, 01/18/2022, 01/01/2023, 02/01/2024   Moderna Sars-Covid-2  Vaccination 05/05/2019   Pfizer Covid-19 Vaccine Bivalent Booster 86yrs & up 01/01/2023   Pneumococcal Conjugate-13 01/18/2014, 02/15/2014   Pneumococcal Polysaccharide-23 09/10/2009   Respiratory Syncytial Virus Vaccine,Recomb Aduvanted(Arexvy) 04/15/2022   Tdap 01/26/2013   Zoster Recombinant(Shingrix) 11/16/2017, 01/21/2018   Zoster, Live 02/18/2010   Pertinent  Health Maintenance Due  Topic Date Due   Influenza Vaccine  Completed       No data to display         Functional Status Survey:     Vitals:   03/08/24 1952  BP: 123/68  Pulse: 98  Resp: 16  Temp: 97.8 F (36.6 C)  SpO2: 97%   There is no height or weight on file to calculate BMI. Physical Exam Vitals and nursing note reviewed.  Constitutional:      Comments: Awake no distress. Wife Martie at bedside.  HENT:     Head: Normocephalic and atraumatic.  Eyes:     General: No scleral icterus. Pulmonary:     Effort: Pulmonary effort is normal. No respiratory distress.  Abdominal:     General: There is no distension.  Neurological:     Motor: No weakness.     Gait: Gait abnormal.     Comments: Antalgic gait. Favors his left leg. Using his rollator    Labs reviewed: Recent Labs    04/26/23 1242 10/25/23 0000 02/21/24 0000  NA 137 139 138  K 3.7 3.6 4.6  CL 97* 101 102  CO2 29 29* 32*  GLUCOSE 132*  --   --   BUN 16 23* 21  CREATININE 0.67 0.7 0.8  CALCIUM 8.9 8.4* 9.4   Recent Labs    02/21/24 0000  AST 17  ALT 24  ALKPHOS 68  ALBUMIN 3.8   Recent Labs    04/26/23 1242 02/21/24 0000  WBC 9.4 5.2  NEUTROABS  --  2,657.00  HGB 13.5 13.5  HCT 40.7 41  MCV 98.5  --   PLT 268 207      Joint Injection/Arthrocentesis  Date/Time: 03/10/2024 3:00 PM  Performed by: Laurence Locus, DO Authorized by: Laurence Locus, DO  Indications: pain  Body area: knee Joint: right knee Local anesthesia used: yes Anesthesia: local infiltration  Anesthesia: Local anesthesia used: yes Local Anesthetic:  bupivacaine 0.5% without epinephrine Anesthetic total: 3 mL  Sedation: Patient sedated: no  Preparation: Patient was prepped and draped in the usual sterile fashion. Needle gauge: 25. Ultrasound guidance: no Approach: anterior Aspirate amount: 0 mL Triamcinolone amount: 40 mg Bupivacaine 0.50% amount: 2 mL Patient tolerance: patient tolerated the procedure well with no immediate complications Comments: Operator: Locus Laurence, DO  Assistant: Greig Cluster, NP  Post-procedure. Pt able to quickly stand up and walk using his rollator. The speed at which is was walking was at least 50-75% better after knee injection. Wife was delighted pt was walking so well after knee injection.    Assessment & Plan Primary osteoarthritis of right knee Pt had 40 mg of Kenalog and a total  of 4 ml of marcaine 0.5% injection into right knee joint. Pt able to walk at least 50-75% faster than he did before the injection. Pt's wife was very pleased that pt was able to walk so well.  Will re-visit how his knee is doing next week to see what effect the steroids have on his OA of right knee.  Orders:   Joint Injection/Arthrocentesis     Camellia Door, DO [facility] [phone]

## 2024-03-21 ENCOUNTER — Encounter: Payer: Self-pay | Admitting: Pharmacist

## 2024-03-21 NOTE — Progress Notes (Signed)
   03/21/2024  Patient ID: Levi Wilkinson, male   DOB: 09-25-1934, 89 y.o.   MRN: [id number]  Pharmacy Quality Measure Review  This patient is appearing on a report for being at risk of failing the adherence measure for cholesterol (statin) medications this calendar year.   Medication: Atorvastatin 20 mg Last fill date: 03/01/24 for 12 day supply  Medications filled at [facility] as the Patient is a nursing home resident.  Levi Wilkinson, PharmD, BCACP Clinical Pharmacist [phone]

## 2024-03-23 ENCOUNTER — Non-Acute Institutional Stay: Payer: Self-pay | Admitting: Nurse Practitioner

## 2024-03-23 ENCOUNTER — Encounter: Payer: Self-pay | Admitting: Nurse Practitioner

## 2024-03-23 DIAGNOSIS — M1711 Unilateral primary osteoarthritis, right knee: Secondary | ICD-10-CM | POA: Diagnosis not present

## 2024-03-23 DIAGNOSIS — F02B4 Dementia in other diseases classified elsewhere, moderate, with anxiety: Secondary | ICD-10-CM | POA: Diagnosis not present

## 2024-03-23 DIAGNOSIS — G301 Alzheimer's disease with late onset: Secondary | ICD-10-CM | POA: Diagnosis not present

## 2024-03-23 NOTE — Progress Notes (Signed)
 Location:  Other [place].  Nursing Home Room Number: [facility] [id number] Place of Service:  ALF [phone]) Harlene An, NP  PCP: Laurence Locus, DO  Patient Care Team: Laurence Locus, DO as PCP - General (Internal Medicine)  Extended Emergency Contact Information Primary Emergency Contact: Sweeny,Martie Mobile Phone: [phone] Relation: Spouse Secondary Emergency Contact: Hoffart,martie Mobile Phone: [phone] Relation: Spouse  Goals of care: Advanced Directive information    01/31/2024    3:07 PM  Advanced Directives  Does Patient Have a Medical Advance Directive? Yes  Type of Advance Directive Out of facility DNR (pink MOST or yellow form)  Does patient want to make changes to medical advance directive? No - Patient declined     Chief Complaint  Patient presents with   Knee Pain    Knee Pain.     HPI:  Pt is a 89 y.o. male seen today for an acute visit for ongoing knee pain and decrease mobility Pt with hx of dementa, OA, debility.  He had knee injection on 11/21 and wife remains concerned due to the pain and decrease in mobility.  Staff reports he has also had a progression in dementia with increase in anxiety and decrease in functional status overall. Requiring more assistance with ALDs Also having a harder time with medication Chewing pills.  He had a recent GDR of cymbalta but staff reports pain with more pain and anxiety/agitation since this has been decreased.   Past Medical History:  Diagnosis Date   Anxiety    Chronic low back pain    Closed compression fracture of L3 vertebra (HCC)    Diverticulosis of large intestine without hemorrhage 03/05/2014   Overview:   Noted on colonoscopy 2010     Dry age-related macular degeneration 03/26/2017   GERD (gastroesophageal reflux disease)    H/O: rheumatic fever 03/26/2017   Moderate Alzheimer's dementia with anxiety (HCC) 02/12/2023   Neuropathic pain    Primary osteoarthritis of right knee    Past Surgical  History:  Procedure Laterality Date   KYPHOPLASTY      No Known Allergies  Outpatient Encounter Medications as of 03/23/2024  Medication Sig   acetaminophen (TYLENOL) 500 MG tablet Take 1,000 mg by mouth 3 (three) times daily.   ammonium lactate (LAC-HYDRIN) 12 % lotion Apply 1 Application topically daily.   carboxymethylcellulose (REFRESH PLUS) 0.5 % SOLN Place 1 drop into both eyes 3 (three) times daily as needed.   celecoxib (CELEBREX) 100 MG capsule Take 100 mg by mouth daily.   DULoxetine (CYMBALTA) 30 MG capsule Take 30 mg by mouth at bedtime.   Emollient (EUCERIN) lotion Apply topically daily.   hydrocortisone 2.5 % cream Apply 1 Application topically 2 (two) times daily as needed.   ketoconazole (NIZORAL) 2 % shampoo Wash scalp 3-4 times weekly. Let sit for 5 minutes then wash out   omeprazole (PRILOSEC) 20 MG capsule Take 20 mg by mouth every other day.   polyethylene glycol (MIRALAX / GLYCOLAX) 17 g packet Take 17 g by mouth daily.   QUEtiapine (SEROQUEL) 25 MG tablet Take 75 mg by mouth at bedtime.   atorvastatin (LIPITOR) 20 MG tablet Take 20 mg by mouth daily. (Patient not taking: Reported on 03/23/2024)   Cholecalciferol (VITAMIN D3) 50 MCG (2000 UT) CAPS Take 1 capsule by mouth at bedtime. (Patient not taking: Reported on 03/23/2024)   diclofenac Sodium (VOLTAREN) 1 % GEL Apply 4 g topically 3 (three) times daily as needed. (Patient not taking: Reported on 03/23/2024)  ergocalciferol (VITAMIN D2) 50000 units capsule Take by mouth. (Patient not taking: Reported on 03/23/2024)   glucosamine-chondroitin 500-400 MG tablet Take 1 tablet by mouth daily. (Patient not taking: Reported on 03/23/2024)   Multiple Vitamins-Minerals (PRESERVISION AREDS 2) CHEW Chew 1 tablet by mouth 2 (two) times daily. (Patient not taking: Reported on 03/23/2024)   No facility-administered encounter medications on file as of 03/23/2024.    Review of Systems  Unable to perform ROS: Dementia     Immunization History  Administered Date(s) Administered   Fluad Quad(high Dose 65+) 12/31/2018   INFLUENZA, HIGH DOSE SEASONAL PF 12/26/2014, 01/12/2017, 02/13/2018   Influenza-Unspecified 01/20/2014, 02/15/2014, 12/26/2014, 11/19/2015, 11/29/2015, 01/12/2017, 01/27/2021, 01/18/2022, 01/01/2023, 02/01/2024   Moderna Sars-Covid-2 Vaccination 05/05/2019   Pfizer Covid-19 Vaccine Bivalent Booster 30yrs & up 01/01/2023   Pneumococcal Conjugate-13 01/18/2014, 02/15/2014   Pneumococcal Polysaccharide-23 09/10/2009   Respiratory Syncytial Virus Vaccine,Recomb Aduvanted(Arexvy) 04/15/2022   Tdap 01/26/2013   Zoster Recombinant(Shingrix) 11/16/2017, 01/21/2018   Zoster, Live 02/18/2010   Pertinent  Health Maintenance Due  Topic Date Due   Influenza Vaccine  Completed       No data to display         Functional Status Survey:    Vitals:   03/23/24 0857  BP: 138/77  Pulse: 78  Resp: 19  Temp: 97.8 F (36.6 C)  SpO2: 97%  Weight: 182 lb (82.6 kg)  Height: 5' 4 (1.626 m)   Body mass index is 31.24 kg/m. Physical Exam Constitutional:      Appearance: Normal appearance.  Pulmonary:     Effort: Pulmonary effort is normal.  Neurological:     Mental Status: He is alert. Mental status is at baseline.  Psychiatric:        Mood and Affect: Mood normal.     Labs reviewed: Recent Labs    04/26/23 1242 10/25/23 0000 02/21/24 0000  NA 137 139 138  K 3.7 3.6 4.6  CL 97* 101 102  CO2 29 29* 32*  GLUCOSE 132*  --   --   BUN 16 23* 21  CREATININE 0.67 0.7 0.8  CALCIUM 8.9 8.4* 9.4   Recent Labs    02/21/24 0000  AST 17  ALT 24  ALKPHOS 68  ALBUMIN 3.8   Recent Labs    04/26/23 1242 02/21/24 0000  WBC 9.4 5.2  NEUTROABS  --  2,657.00  HGB 13.5 13.5  HCT 40.7 41  MCV 98.5  --   PLT 268 207   No results found for: TSH No results found for: HGBA1C No results found for: CHOL, HDL, LDLCALC, LDLDIRECT, TRIG, CHOLHDL  Significant Diagnostic  Results in last 30 days:  No results found.  Assessment/Plan 1. Primary osteoarthritis of right knee (Primary) Discussed progression of OA Continues on tylenol and celebrex Also due to progression of dementia he is not walking as much and becoming weaker which could be contributing to increase in pain.  Noted to have more pain with decrease in cymbalta- will increase back to 60 mg daily at this time  2. Moderate late onset Alzheimer's dementia with anxiety (HCC) -progressive decline. Worsening anxiety with decrease in Cymbalta. Will increase back to 60 mg daily Continue supportive care with staff and wife.    Rama Mcclintock K. Caro BODILY [facility] [phone]

## 2024-04-07 ENCOUNTER — Non-Acute Institutional Stay: Payer: Self-pay | Admitting: Internal Medicine

## 2024-04-07 ENCOUNTER — Encounter: Payer: Self-pay | Admitting: Internal Medicine

## 2024-04-07 DIAGNOSIS — M1711 Unilateral primary osteoarthritis, right knee: Secondary | ICD-10-CM

## 2024-04-07 DIAGNOSIS — M792 Neuralgia and neuritis, unspecified: Secondary | ICD-10-CM | POA: Diagnosis not present

## 2024-04-07 DIAGNOSIS — G301 Alzheimer's disease with late onset: Secondary | ICD-10-CM | POA: Diagnosis not present

## 2024-04-07 DIAGNOSIS — I10 Essential (primary) hypertension: Secondary | ICD-10-CM

## 2024-04-07 DIAGNOSIS — F419 Anxiety disorder, unspecified: Secondary | ICD-10-CM | POA: Diagnosis not present

## 2024-04-07 DIAGNOSIS — Z5181 Encounter for therapeutic drug level monitoring: Secondary | ICD-10-CM | POA: Diagnosis not present

## 2024-04-07 DIAGNOSIS — F02B4 Dementia in other diseases classified elsewhere, moderate, with anxiety: Secondary | ICD-10-CM

## 2024-04-07 DIAGNOSIS — Z0000 Encounter for general adult medical examination without abnormal findings: Secondary | ICD-10-CM

## 2024-04-07 MED ORDER — OXYCODONE HCL 5 MG PO TABS: 2.5000 mg | ORAL_TABLET | Freq: Four times a day (QID) | ORAL | 0 refills | Status: AC | PRN

## 2024-04-07 NOTE — Assessment & Plan Note (Addendum)
 Continue seroquel 75 mg at bedtime to help with evening agitation.

## 2024-04-07 NOTE — Assessment & Plan Note (Addendum)
 Continue with cymbalta 60 mg daily. When dose reduction attempted in November 2025, pt had increased anxiety and pain. Do Not attempt another GDR of cymbalta.

## 2024-04-07 NOTE — Assessment & Plan Note (Addendum)
 [id number](5uy qtr 2025) Cymbalta 60 mg --> 30 mg daily for attempted at Gradual Dose Reduction  03-23-2024 Cymbalta increased back to 60 mg daily due to worsening anxiety. Repeat attempts at GDR of Cymbalta is contraindicated due to worsening of his anxiety when Cymbalta was weaned to 30 mg daily.

## 2024-04-07 NOTE — Progress Notes (Signed)
 [facility] Routine Care Progress Note    Location:  Other [place].  Nursing Home Room Number: [facility] [id number] Place of Service:  ALF (13)   PCP: Laurence Locus, DO   Patient Care Team: Laurence Locus, DO as PCP - General (Internal Medicine)   Extended Emergency Contact Information Primary Emergency Contact: Wagman,Martie Mobile Phone: [phone] Relation: Spouse Secondary Emergency Contact: Ruscitti,martie Mobile Phone: [phone] Relation: Spouse   Goals of care: Advanced Directive information    04/07/2024    8:33 AM  Advanced Directives  Does Patient Have a Medical Advance Directive? Yes  Type of Advance Directive Out of facility DNR (pink MOST or yellow form)  Does patient want to make changes to medical advance directive? No - Patient declined     CODE STATUS: Do Not Resuscitate (DNR)   Chief Complaint  Patient presents with   Knee Pain    Right Knee Pain.      HPI: Pt is a 89 y.o. male seen today for an routine and acute visit for Right Knee Pain.     Patient is a 89 year old male with a history of right knee osteoarthritis, chronic back pain, moderate to severe Alzheimer type dementia with anxiety, macular degeneration, neuropathic pain, dysphagia who is seen for acute visit for worsening right knee pain.  I was able to call his wife Johann and discussed the case further as the patient has dementia and cannot give a history.  She states that patient did well after his Kenalog steroid injection he had to his right knee on March 10, 2024.  He was walking very well for several weeks but most recently he started limping more.  She can hear crunching noises coming from his right knee.  He is walking more with an antalgic gait.  She is afraid he is going to fall.  She wonders if there is anything else that can be done for him.  His wife was already stated previously that the patient is not a surgical candidate for knee replacement.  I agree with that given his  dementia.  It is way too early to give another steroid injection to his knee.  We discussed that given his other medical issues, that if he loses the ability to ambulate, he will likely decline very rapidly.  Discussed that starting oxycodone , while may be habit-forming, may be the best solution to keeping the patient mobile.  I discussed that his overall prognosis remains poor and that if the patient has an acute medical episode that requires hospitalization, that he may not survive it.  In the end, wife agreed to start oxycodone  as needed to see if this helps the patient's ambulatory status.  I also told the wife that she should contact the patient's 2 daughters and let them know that the patient's health is declining.  He thanked me for my concern about the patient and addition of pain medication to help with the patient's right knee pain.   Past Medical History:  Diagnosis Date   Anxiety    Chronic low back pain    Closed compression fracture of L3 vertebra (HCC)    Diverticulosis of large intestine without hemorrhage 03/05/2014   Overview:   Noted on colonoscopy 2010     Dry age-related macular degeneration 03/26/2017   GERD (gastroesophageal reflux disease)    H/O: rheumatic fever 03/26/2017   Moderate Alzheimer's dementia with anxiety (HCC) 02/12/2023   Neuropathic pain    Primary osteoarthritis of right knee  Past Surgical History:  Procedure Laterality Date   KYPHOPLASTY       Allergies[1]   Outpatient Encounter Medications as of 04/07/2024  Medication Sig   acetaminophen (TYLENOL) 500 MG tablet Take 1,000 mg by mouth 3 (three) times daily.   ammonium lactate (LAC-HYDRIN) 12 % lotion Apply 1 Application topically daily.   carboxymethylcellulose (REFRESH PLUS) 0.5 % SOLN Place 1 drop into both eyes 3 (three) times daily as needed.   celecoxib (CELEBREX) 100 MG capsule Take 100 mg by mouth 2 (two) times daily.   DULoxetine (CYMBALTA) 60 MG capsule Take 60 mg by mouth daily.    Emollient (EUCERIN) lotion Apply topically daily.   hydrocortisone 2.5 % cream Apply 1 Application topically 2 (two) times daily as needed.   ketoconazole (NIZORAL) 2 % shampoo Wash scalp 3-4 times weekly. Let sit for 5 minutes then wash out   omeprazole (PRILOSEC) 20 MG capsule Take 20 mg by mouth daily.   oxyCODONE  (OXY IR/ROXICODONE ) 5 MG immediate release tablet Take 0.5-1 tablets (2.5-5 mg total) by mouth every 6 (six) hours as needed (2.5 mg q6h prn moderate pain. 5 mg q6h prn severe pain).   polyethylene glycol (MIRALAX / GLYCOLAX) 17 g packet Take 17 g by mouth daily.   QUEtiapine (SEROQUEL) 25 MG tablet Take 75 mg by mouth at bedtime.   No facility-administered encounter medications on file as of 04/07/2024.     Review of Systems  Unable to perform ROS: Dementia     Immunization History  Administered Date(s) Administered   Fluad Quad(high Dose 65+) 12/31/2018   INFLUENZA, HIGH DOSE SEASONAL PF 12/26/2014, 01/12/2017, 02/13/2018   Influenza-Unspecified 01/20/2014, 02/15/2014, 12/26/2014, 11/19/2015, 11/29/2015, 01/12/2017, 01/27/2021, 01/18/2022, 01/01/2023, 02/01/2024   Moderna Sars-Covid-2 Vaccination 05/05/2019   Pfizer Covid-19 Vaccine Bivalent Booster 75yrs & up 01/01/2023   Pneumococcal Conjugate-13 01/18/2014, 02/15/2014   Pneumococcal Polysaccharide-23 09/10/2009   Respiratory Syncytial Virus Vaccine,Recomb Aduvanted(Arexvy) 04/15/2022   Tdap 01/26/2013   Zoster Recombinant(Shingrix) 11/16/2017, 01/21/2018   Zoster, Live 02/18/2010   Pertinent  Health Maintenance Due  Topic Date Due   Influenza Vaccine  Completed       No data to display         Functional Status Survey:     Vitals:   04/07/24 0826  BP: 120/74  Pulse: 91  Resp: 18  Temp: (!) 97.3 F (36.3 C)  SpO2: 97%  Weight: 188 lb 3.2 oz (85.4 kg)  Height: 5' 4 (1.626 m)   Body mass index is 32.3 kg/m. Physical Exam Vitals and nursing note reviewed.  Constitutional:      General: He is  not in acute distress.    Appearance: He is obese. He is not toxic-appearing or diaphoretic.     Comments: Awake but demented.  Sitting in the TV room watching a Christmas movie.  HENT:     Head: Normocephalic and atraumatic.     Nose: Nose normal.  Eyes:     General: No scleral icterus. Cardiovascular:     Rate and Rhythm: Normal rate and regular rhythm.  Pulmonary:     Effort: Pulmonary effort is normal.     Breath sounds: Normal breath sounds.  Abdominal:     General: Bowel sounds are normal. There is no distension.  Musculoskeletal:     Comments: Right knee and a knee sleeve.  He has tenderness along the medial and lateral joint lines of his right knee.  No significant joint effusion is noted.  Skin:  General: Skin is warm and dry.  Neurological:     Comments: Pleasantly demented.      Labs reviewed: Recent Labs    04/26/23 1242 10/25/23 0000 02/21/24 0000  NA 137 139 138  K 3.7 3.6 4.6  CL 97* 101 102  CO2 29 29* 32*  GLUCOSE 132*  --   --   BUN 16 23* 21  CREATININE 0.67 0.7 0.8  CALCIUM 8.9 8.4* 9.4   Recent Labs    02/21/24 0000  AST 17  ALT 24  ALKPHOS 68  ALBUMIN 3.8   Recent Labs    04/26/23 1242 02/21/24 0000  WBC 9.4 5.2  NEUTROABS  --  2,657.00  HGB 13.5 13.5  HCT 40.7 41  MCV 98.5  --   PLT 268 207     Assessment and Plan: Encounter for medication titration 02-2024(4th qtr 2025) Cymbalta 60 mg --> 30 mg daily for attempted at Gradual Dose Reduction  03-23-2024 Cymbalta increased back to 60 mg daily due to worsening anxiety. Repeat attempts at GDR of Cymbalta is contraindicated due to worsening of his anxiety when Cymbalta was weaned to 30 mg daily.  Moderate Alzheimer's dementia with anxiety (HCC) Continue seroquel 75 mg at bedtime to help with evening agitation.  Primary osteoarthritis of right knee Steroid injection into his on 03-10-2024 has already worn off. Pt with increasing limping of his right leg. Wife described audible  crunching/cracking noises when pt attempts to walk on it.  After informed consent and risks of starting opiate therapy, pt's wife Neilson Oehlert is agreeable to starting prn oxycodone  to see if this will keep the pt ambulatory.  Anxiety 02-2024(4th qtr 2025) Cymbalta 60 mg --> 30 mg daily for attempted at Gradual Dose Reduction   03-23-2024 Cymbalta increased back to 60 mg daily due to worsening anxiety. Repeat attempts at GDR of Cymbalta is contraindicated due to worsening of his anxiety when Cymbalta was weaned to 30 mg daily  04/07/2024 continue cymbalta 60 mg daily for anxiety and pain.   Healthcare maintenance I discussed that his overall prognosis remains poor and that if the patient has an acute medical episode that requires hospitalization, that he may not survive it.  In the end, wife agreed to start oxycodone  as needed to see if this helps the patient's ambulatory status.  I also told the wife that she should contact the patient's 2 daughters and let them know that the patient's health is declining.  Essential hypertension stable  Neuropathic pain Continue with cymbalta 60 mg daily. When dose reduction attempted in November 2025, pt had increased anxiety and pain. Do Not attempt another GDR of cymbalta.    Meds ordered this encounter  Medications   oxyCODONE  (OXY IR/ROXICODONE ) 5 MG immediate release tablet    Sig: Take 0.5-1 tablets (2.5-5 mg total) by mouth every 6 (six) hours as needed (2.5 mg q6h prn moderate pain. 5 mg q6h prn severe pain).    Dispense:  60 tablet    Refill:  0      Camellia Door, DO  [facility] [phone]      [1] No Known Allergies

## 2024-04-07 NOTE — Assessment & Plan Note (Addendum)
 [id number](5uy qtr 2025) Cymbalta 60 mg --> 30 mg daily for attempted at Gradual Dose Reduction   03-23-2024 Cymbalta increased back to 60 mg daily due to worsening anxiety. Repeat attempts at GDR of Cymbalta is contraindicated due to worsening of his anxiety when Cymbalta was weaned to 30 mg daily  04/07/2024 continue cymbalta 60 mg daily for anxiety and pain.

## 2024-04-07 NOTE — Assessment & Plan Note (Signed)
 I discussed that his overall prognosis remains poor and that if the patient has an acute medical episode that requires hospitalization, that he may not survive it.  In the end, wife agreed to start oxycodone as needed to see if this helps the patient's ambulatory status.  I also told the wife that she should contact the patient's 2 daughters and let them know that the patient's health is declining.

## 2024-04-07 NOTE — Addendum Note (Signed)
 Addended by: LAURENCE, Leeandra Ellerson on: 04/07/2024 07:11 PM   Modules accepted: Level of Service

## 2024-04-07 NOTE — Assessment & Plan Note (Addendum)
 stable

## 2024-04-07 NOTE — Assessment & Plan Note (Addendum)
 Steroid injection into his on 03-10-2024 has already worn off. Pt with increasing limping of his right leg. Wife described audible crunching/cracking noises when pt attempts to walk on it.  After informed consent and risks of starting opiate therapy, pt's wife Razi Hickle is agreeable to starting prn oxycodone to see if this will keep the pt ambulatory.

## 2024-04-10 LAB — BASIC METABOLIC PANEL WITH GFR
BUN: 21 (ref 4–21)
CO2: 31 — AB (ref 13–22)
Chloride: 102 (ref 99–108)
Creatinine: 0.9 (ref 0.6–1.3)
Glucose: 116
Potassium: 4.3 meq/L (ref 3.5–5.1)
Sodium: 138 (ref 137–147)

## 2024-04-10 LAB — COMPREHENSIVE METABOLIC PANEL WITH GFR: Calcium: 9 (ref 8.7–10.7)

## 2024-05-02 ENCOUNTER — Encounter: Payer: Self-pay | Admitting: Internal Medicine

## 2024-05-02 ENCOUNTER — Non-Acute Institutional Stay: Admitting: Internal Medicine

## 2024-05-02 DIAGNOSIS — M1711 Unilateral primary osteoarthritis, right knee: Secondary | ICD-10-CM

## 2024-05-02 NOTE — Assessment & Plan Note (Signed)
 Wife Levi Wilkinson understands the risks of repeat steroid injection before 90 days.  Verbal informed consent was obtained from the wife.  Will proceed with repeat steroid injection of the right knee.

## 2024-05-02 NOTE — Progress Notes (Signed)
 [facility] Acute Care Progress Note    Location:  Other [facility]) Nursing Home Room Number: 101 P Place of Service:  ALF (13)   Laurence Locus, DO   Patient Care Team: Laurence Locus, DO as PCP - General (Internal Medicine)   Extended Emergency Contact Information Primary Emergency Contact: Doverspike,Martie Mobile Phone: [phone] Relation: Spouse Secondary Emergency Contact: Mcarthy,martie Mobile Phone: [phone] Relation: Spouse   Goals of care: Advanced Directive information    04/07/2024    8:33 AM  Advanced Directives  Does Patient Have a Medical Advance Directive? Yes  Type of Advance Directive Out of facility DNR (pink MOST or yellow form)  Does patient want to make changes to medical advance directive? No - Patient declined     CODE STATUS: Do Not Resuscitate (DNR)   Chief Complaint  Patient presents with   Arthritis    Right knee arthritis      HPI: Pt is a 89 y.o. male seen today for an acute visit for chronic right knee arthritis.  Informed verbal consent obtained from the patient's wife Johann Haver.  Patient last had his knee injected on March 10, 2024.  Patient was able to stand up quickly after his injection.  Since the middle of December, patient's gait has been more antalgic.  He has been sitting in a wheelchair now for at least 2 weeks due to pain.  He still able to stand and pivot but is not walking.  His wife asked if a knee injection could be performed again.  Patient is not a surgical candidate for a total knee replacement which is what he really needs but given his advanced dementia, he is not a candidate for surgical replacement.  We discussed several days ago in the presence of both the nursing director here at [facility] and the psychologist, occupational that repeat injection of the patient's knee with steroids before 90 days would be outside of standard medical care.  The wife understands this and wishes to continue to proceed as the patient  really has no other options given his end-stage arthritis in his right knee.  Wife is agreeable and understands the risks of joint infection, worsening diabetes, worsening hypertension, accelerated joint damage due to frequent corticosteroid administration.  She is willing understands these risks and wishes to proceed.     Past Medical History:  Diagnosis Date   Anxiety    Chronic low back pain    Closed compression fracture of L3 vertebra (HCC)    Diverticulosis of large intestine without hemorrhage 03/05/2014   Overview:   Noted on colonoscopy 2010     Dry age-related macular degeneration 03/26/2017   GERD (gastroesophageal reflux disease)    H/O: rheumatic fever 03/26/2017   Moderate Alzheimer's dementia with anxiety (HCC) 02/12/2023   Neuropathic pain    Primary osteoarthritis of right knee    Past Surgical History:  Procedure Laterality Date   KYPHOPLASTY       Allergies[1]   Outpatient Encounter Medications as of 05/02/2024  Medication Sig   acetaminophen (TYLENOL) 500 MG tablet Take 1,000 mg by mouth 3 (three) times daily.   ammonium lactate (LAC-HYDRIN) 12 % lotion Apply 1 Application topically daily.   bupivacaine,PF, (MARCAINE) 0.5 % SOLN injection 30 mLs by Infiltration route as directed. Into knee until 05/02/2024   carboxymethylcellulose (REFRESH PLUS) 0.5 % SOLN Place 1 drop into both eyes 3 (three) times daily as needed.   celecoxib (CELEBREX) 100 MG capsule Take 100 mg by  mouth 2 (two) times daily.   DULoxetine (CYMBALTA) 60 MG capsule Take 60 mg by mouth daily.   Emollient (EUCERIN) lotion Apply topically daily.   hydrocortisone 2.5 % cream Apply 1 Application topically 2 (two) times daily as needed.   ketoconazole (NIZORAL) 2 % shampoo Wash scalp 3-4 times weekly. Let sit for 5 minutes then wash out   omeprazole (PRILOSEC) 20 MG capsule Take 20 mg by mouth daily.   oxyCODONE  (OXY IR/ROXICODONE ) 5 MG immediate release tablet Take 0.5-1 tablets (2.5-5 mg total) by  mouth every 6 (six) hours as needed (2.5 mg q6h prn moderate pain. 5 mg q6h prn severe pain).   polyethylene glycol (MIRALAX / GLYCOLAX) 17 g packet Take 17 g by mouth daily.   QUEtiapine (SEROQUEL) 25 MG tablet Take 75 mg by mouth at bedtime.   triamcinolone acetonide (KENALOG-40) 40 MG/ML injection Inject 40 mg into the muscle once.   No facility-administered encounter medications on file as of 05/02/2024.     Review of Systems  Unable to perform ROS: Dementia     Immunization History  Administered Date(s) Administered   Fluad Quad(high Dose 65+) 12/31/2018   INFLUENZA, HIGH DOSE SEASONAL PF 12/26/2014, 01/12/2017, 02/13/2018   Influenza-Unspecified 01/20/2014, 02/15/2014, 12/26/2014, 11/19/2015, 11/29/2015, 01/12/2017, 01/27/2021, 01/18/2022, 01/01/2023, 02/01/2024   Moderna Sars-Covid-2 Vaccination 05/05/2019   Pfizer Covid-19 Vaccine Bivalent Booster 91yrs & up 01/01/2023   Pneumococcal Conjugate-13 01/18/2014, 02/15/2014   Pneumococcal Polysaccharide-23 09/10/2009   Respiratory Syncytial Virus Vaccine,Recomb Aduvanted(Arexvy) 04/15/2022   Tdap 01/26/2013   Zoster Recombinant(Shingrix) 11/16/2017, 01/21/2018   Zoster, Live 02/18/2010   Pertinent  Health Maintenance Due  Topic Date Due   Influenza Vaccine  Completed       No data to display         Functional Status Survey:     Vitals:   05/02/24 1451  BP: 113/75  Pulse: 91  Weight: 191 lb 12.8 oz (87 kg)  Height: 5' 4 (1.626 m)   Body mass index is 32.92 kg/m. Physical Exam Vitals reviewed.  Constitutional:      General: He is not in acute distress.    Appearance: He is not toxic-appearing or diaphoretic.  Pulmonary:     Effort: Pulmonary effort is normal. No respiratory distress.  Musculoskeletal:     Comments: Right knee arthritis.  Audible crunching sounds emanating from his right knee when he is standing up on his right knee.      Labs reviewed: Recent Labs    10/25/23 0000 02/21/24 0000  04/10/24 0000  NA 139 138 138  K 3.6 4.6 4.3  CL 101 102 102  CO2 29* 32* 31*  BUN 23* 21 21  CREATININE 0.7 0.8 0.9  CALCIUM 8.4* 9.4 9.0   Recent Labs    02/21/24 0000  AST 17  ALT 24  ALKPHOS 68  ALBUMIN 3.8   Recent Labs    02/21/24 0000  WBC 5.2  NEUTROABS 2,657.00  HGB 13.5  HCT 41  PLT 207     Assessment and Plan: Primary osteoarthritis of right knee Wife Johann Haver understands the risks of repeat steroid injection before 90 days.  Verbal informed consent was obtained from the wife.  Will proceed with repeat steroid injection of the right knee.     There are no discontinued medications. Orders Placed This Encounter  Procedures   Basic metabolic panel with GFR    This external order was created through the Results Console.   Comprehensive metabolic panel  with GFR    This external order was created through the Results Console.  Joint Injection/Arthrocentesis  Date/Time: 05/02/2024 4:00 PM  Performed by: Laurence Locus, DO Authorized by: Laurence Locus, DO  Indications: pain  Body area: knee Joint: right knee Local anesthesia used: yes Anesthesia: local infiltration  Anesthesia: Local anesthesia used: yes Local Anesthetic: bupivacaine 0.5% without epinephrine (3 ml) Anesthetic total: 3 mL  Sedation: Patient sedated: no  Preparation: Patient was prepped and draped in the usual sterile fashion. Needle gauge: 27 gauge. Ultrasound guidance: no Approach: lateral Aspirate amount: 0 mL Triamcinolone amount: 40 mg Bupivacaine 0.50% amount: 2 mL Patient tolerance: patient tolerated the procedure well with no immediate complications     Assistant: Harlene An, NP   Locus Laurence, DO [facility] [phone]     [1] No Known Allergies

## 2024-05-05 ENCOUNTER — Encounter: Payer: Self-pay | Admitting: Orthopedic Surgery

## 2024-05-05 ENCOUNTER — Non-Acute Institutional Stay: Payer: Self-pay | Admitting: Orthopedic Surgery

## 2024-05-05 DIAGNOSIS — R635 Abnormal weight gain: Secondary | ICD-10-CM

## 2024-05-05 DIAGNOSIS — M170 Bilateral primary osteoarthritis of knee: Secondary | ICD-10-CM | POA: Diagnosis not present

## 2024-05-05 DIAGNOSIS — F419 Anxiety disorder, unspecified: Secondary | ICD-10-CM | POA: Diagnosis not present

## 2024-05-05 DIAGNOSIS — I10 Essential (primary) hypertension: Secondary | ICD-10-CM

## 2024-05-05 DIAGNOSIS — F03918 Unspecified dementia, unspecified severity, with other behavioral disturbance: Secondary | ICD-10-CM

## 2024-05-05 NOTE — Progress Notes (Signed)
 " Location:  Other [place].  Nursing Home Room Number: [facility] [id number] Place of Service:  ALF [phone]) Provider:  Greig Cluster, NP  PCP: Laurence Locus, DO  Patient Care Team: Laurence Locus, DO as PCP - General (Internal Medicine)  Extended Emergency Contact Information Primary Emergency Contact: Morgenthaler,Martie Mobile Phone: [phone] Relation: Spouse Secondary Emergency Contact: Emond,martie Mobile Phone: [phone] Relation: Spouse  Code Status:  DNR Goals of care: Advanced Directive information    04/07/2024    8:33 AM  Advanced Directives  Does Patient Have a Medical Advance Directive? Yes  Type of Advance Directive Out of facility DNR (pink MOST or yellow form)  Does patient want to make changes to medical advance directive? No - Patient declined     Chief Complaint  Patient presents with   Medical Management of Chronic Issues    Medical Management of Chronic issues.     HPI:  Pt is a 89 y.o. male seen today for medical management of chronic diseases.    He currently resides on the memory care unit at [facility]. PMH: HTN, HLD, GERD, AD, osteopenia, neuropathic pain, OA, BPH, prediabetes and macular degeneration.   OA-  11/21 & 01/13 right knee steroid injection given> wife reports he is now able to transfer on his own, remains on tylenol, celebrex and oxycodone  prn AD- some agitation with ADLs, limited communication, ambulates with wheelchair, remains on Seroquel  Anxiety- no recent panic per nursing, Na+ 138 04/10/2024, remains on Cymbalta  HTN- see trends below, BUN/creat 21/0.9 04/10/2024, not on medication  Weight gain- see trends below, no recent TSH, remains on regular diet  Discussed concerns with wife via telephone. Continues to have concern about weight gain. She spends lunch or dinner with him daily. She tried to promote protein and vegetables for his diet.   Recent weights:  01/01- 184.2 lbs  12/03- 183.0 lbs  11/05- 178.6 lbs   04/26/2023- 152.4  lbs   Recent blood pressures:  01/14- 102/62  01/07- 113/75  12/31- 123/75         Past Medical History:  Diagnosis Date   Anxiety    Chronic low back pain    Closed compression fracture of L3 vertebra (HCC)    Diverticulosis of large intestine without hemorrhage 03/05/2014   Overview:   Noted on colonoscopy 2010     Dry age-related macular degeneration 03/26/2017   GERD (gastroesophageal reflux disease)    H/O: rheumatic fever 03/26/2017   Moderate Alzheimer's dementia with anxiety (HCC) 02/12/2023   Neuropathic pain    Primary osteoarthritis of right knee    Past Surgical History:  Procedure Laterality Date   KYPHOPLASTY      Allergies[1]  Outpatient Encounter Medications as of 05/05/2024  Medication Sig   acetaminophen (TYLENOL) 500 MG tablet Take 1,000 mg by mouth 3 (three) times daily.   ammonium lactate (LAC-HYDRIN) 12 % lotion Apply 1 Application topically daily.   carboxymethylcellulose (REFRESH PLUS) 0.5 % SOLN Place 1 drop into both eyes 3 (three) times daily as needed.   celecoxib (CELEBREX) 100 MG capsule Take 100 mg by mouth 2 (two) times daily.   DULoxetine (CYMBALTA) 60 MG capsule Take 60 mg by mouth daily.   Emollient (EUCERIN) lotion Apply topically daily.   hydrocortisone 2.5 % cream Apply 1 Application topically 2 (two) times daily as needed.   ketoconazole (NIZORAL) 2 % shampoo Wash scalp 3-4 times weekly. Let sit for 5 minutes then wash out   omeprazole (PRILOSEC) 20  MG capsule Take 20 mg by mouth daily.   oxyCODONE  (OXY IR/ROXICODONE ) 5 MG immediate release tablet Take 0.5-1 tablets (2.5-5 mg total) by mouth every 6 (six) hours as needed (2.5 mg q6h prn moderate pain. 5 mg q6h prn severe pain).   polyethylene glycol (MIRALAX / GLYCOLAX) 17 g packet Take 17 g by mouth daily.   QUEtiapine (SEROQUEL) 25 MG tablet Take 75 mg by mouth at bedtime.   bupivacaine,PF, (MARCAINE) 0.5 % SOLN injection 30 mLs by Infiltration route as directed. Into knee until  05/02/2024 (Patient not taking: Reported on 05/05/2024)   No facility-administered encounter medications on file as of 05/05/2024.    Review of Systems  Unable to perform ROS: Dementia    Immunization History  Administered Date(s) Administered   Fluad Quad(high Dose 65+) 12/31/2018   INFLUENZA, HIGH DOSE SEASONAL PF 12/26/2014, 01/12/2017, 02/13/2018   Influenza-Unspecified 01/20/2014, 02/15/2014, 12/26/2014, 11/19/2015, 11/29/2015, 01/12/2017, 01/27/2021, 01/18/2022, 01/01/2023, 02/01/2024   Moderna Sars-Covid-2 Vaccination 05/05/2019   Pfizer Covid-19 Vaccine Bivalent Booster 24yrs & up 01/01/2023   Pneumococcal Conjugate-13 01/18/2014, 02/15/2014   Pneumococcal Polysaccharide-23 09/10/2009   Respiratory Syncytial Virus Vaccine,Recomb Aduvanted(Arexvy) 04/15/2022   Tdap 01/26/2013   Zoster Recombinant(Shingrix) 11/16/2017, 01/21/2018   Zoster, Live 02/18/2010   Pertinent  Health Maintenance Due  Topic Date Due   Influenza Vaccine  Completed       No data to display         Functional Status Survey:    Vitals:   05/05/24 0931  BP: 102/62  Pulse: 84  Resp: 14  Temp: 98.2 F (36.8 C)  SpO2: 97%  Weight: 191 lb 12.8 oz (87 kg)  Height: 5' 4 (1.626 m)   Body mass index is 32.92 kg/m. Physical Exam Vitals reviewed.  Constitutional:      General: He is not in acute distress. HENT:     Head: Normocephalic.  Eyes:     General:        Right eye: No discharge.        Left eye: No discharge.  Cardiovascular:     Rate and Rhythm: Normal rate and regular rhythm.     Pulses: Normal pulses.     Heart sounds: Normal heart sounds.  Pulmonary:     Effort: Pulmonary effort is normal. No respiratory distress.     Breath sounds: Normal breath sounds. No wheezing or rales.  Abdominal:     General: Bowel sounds are normal. There is no distension.     Palpations: Abdomen is soft.     Tenderness: There is no abdominal tenderness.  Musculoskeletal:     Cervical back: Neck  supple.     Right lower leg: Edema present.     Left lower leg: Edema present.     Comments: BLE non pitting   Skin:    General: Skin is warm.     Capillary Refill: Capillary refill takes less than 2 seconds.  Neurological:     General: No focal deficit present.     Mental Status: He is alert. Mental status is at baseline.     Motor: Weakness present.     Gait: Gait abnormal.  Psychiatric:        Mood and Affect: Mood normal.     Comments: Flat affect, alert to self/familiar face, follows come commands, aphasia      Labs reviewed: Recent Labs    10/25/23 0000 02/21/24 0000 04/10/24 0000  NA 139 138 138  K 3.6 4.6 4.3  CL 101 102 102  CO2 29* 32* 31*  BUN 23* 21 21  CREATININE 0.7 0.8 0.9  CALCIUM 8.4* 9.4 9.0   Recent Labs    02/21/24 0000  AST 17  ALT 24  ALKPHOS 68  ALBUMIN 3.8   Recent Labs    02/21/24 0000  WBC 5.2  NEUTROABS 2,657.00  HGB 13.5  HCT 41  PLT 207   No results found for: TSH No results found for: HGBA1C No results found for: CHOL, HDL, LDLCALC, LDLDIRECT, TRIG, CHOLHDL  Significant Diagnostic Results in last 30 days:  No results found.  Assessment/Plan 1. Primary osteoarthritis of both knees (Primary) - ongoing - improved with steroid injections> last given 01/13> now can transfer on own - cont tylenol, celebrex, oxycodone  prn  2. Dementia with behavioral disturbance (HCC) - 04/2023 moved to memory care - some behaviors with ADLs - unstable gait, ambulates wit wheelchair - limited communication  - increased weight - cont Seroquel   3. Anxiety - no recent panic  - Na+ stable - cont Cymbalta   4. Essential hypertension - controlled, goal < 150/90 - not on medication   5. Weight gain - ongoing - BMI 32.92 - admission weight 152 lbs > now 184 - sedentary lifestyle - Seroquel contributing cause  - wife promotes protein/vegetable diet  - no recent TSH> will check  - continue to limit sugar and carbs in  diet     Family/ staff Communication: plan discussed with patient, wife and nurse  Labs/tests ordered:  TSH 05/11/2024        [1] No Known Allergies  "

## 2024-05-18 ENCOUNTER — Encounter: Payer: Self-pay | Admitting: Nurse Practitioner

## 2024-05-18 NOTE — Progress Notes (Signed)
 This encounter was created in error - please disregard.
